# Patient Record
Sex: Male | Born: 1985 | Race: White | Hispanic: No | Marital: Single | Smoking: Former smoker
Health system: Southern US, Community
[De-identification: ages and names within clinical notes are randomized; demographics above are authoritative.]

## PROBLEM LIST (undated history)

## (undated) ENCOUNTER — Emergency Department (HOSPITAL_COMMUNITY): Admission: EM | Payer: Self-pay

## (undated) DIAGNOSIS — F909 Attention-deficit hyperactivity disorder, unspecified type: Secondary | ICD-10-CM

## (undated) DIAGNOSIS — J45909 Unspecified asthma, uncomplicated: Secondary | ICD-10-CM

## (undated) DIAGNOSIS — M795 Residual foreign body in soft tissue: Secondary | ICD-10-CM

## (undated) DIAGNOSIS — Z8719 Personal history of other diseases of the digestive system: Secondary | ICD-10-CM

## (undated) HISTORY — DX: Personal history of other diseases of the digestive system: Z87.19

## (undated) HISTORY — PX: APPENDECTOMY: SHX54

## (undated) HISTORY — PX: TONSILLECTOMY: SUR1361

## (undated) HISTORY — PX: OTHER SURGICAL HISTORY: SHX169

---

## 2007-10-27 ENCOUNTER — Ambulatory Visit: Payer: Self-pay | Admitting: *Deleted

## 2007-10-27 ENCOUNTER — Emergency Department (HOSPITAL_COMMUNITY): Admission: EM | Admit: 2007-10-27 | Discharge: 2007-10-27 | Payer: Self-pay | Admitting: Emergency Medicine

## 2007-10-27 ENCOUNTER — Inpatient Hospital Stay (HOSPITAL_COMMUNITY): Admission: AD | Admit: 2007-10-27 | Discharge: 2007-10-29 | Payer: Self-pay | Admitting: *Deleted

## 2010-01-10 ENCOUNTER — Ambulatory Visit (HOSPITAL_BASED_OUTPATIENT_CLINIC_OR_DEPARTMENT_OTHER): Admission: RE | Admit: 2010-01-10 | Discharge: 2010-01-10 | Payer: Self-pay | Admitting: Orthopedic Surgery

## 2010-01-23 ENCOUNTER — Encounter: Admission: RE | Admit: 2010-01-23 | Discharge: 2010-03-06 | Payer: Self-pay | Admitting: Orthopedic Surgery

## 2010-05-16 ENCOUNTER — Emergency Department (HOSPITAL_COMMUNITY): Admission: EM | Admit: 2010-05-16 | Discharge: 2010-01-04 | Payer: Self-pay | Admitting: Emergency Medicine

## 2010-05-29 ENCOUNTER — Emergency Department (HOSPITAL_COMMUNITY)
Admission: EM | Admit: 2010-05-29 | Discharge: 2010-05-29 | Payer: Self-pay | Source: Home / Self Care | Admitting: Emergency Medicine

## 2010-08-23 LAB — POCT HEMOGLOBIN-HEMACUE: Hemoglobin: 17.7 g/dL — ABNORMAL HIGH (ref 13.0–17.0)
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
## 2010-11-07 ENCOUNTER — Emergency Department (HOSPITAL_COMMUNITY)
Admission: EM | Admit: 2010-11-07 | Discharge: 2010-11-07 | Disposition: A | Discharge status: Home / Self Care | Payer: Self-pay | Attending: Emergency Medicine | Admitting: Emergency Medicine

## 2010-11-07 ENCOUNTER — Emergency Department (HOSPITAL_COMMUNITY): Payer: Self-pay

## 2010-11-07 DIAGNOSIS — R059 Cough, unspecified: Secondary | ICD-10-CM | POA: Insufficient documentation

## 2010-11-07 DIAGNOSIS — J069 Acute upper respiratory infection, unspecified: Secondary | ICD-10-CM | POA: Insufficient documentation

## 2010-11-07 DIAGNOSIS — R05 Cough: Secondary | ICD-10-CM | POA: Insufficient documentation

## 2011-03-05 LAB — DIFFERENTIAL
Basophils Absolute: 0
Eosinophils Absolute: 0
Eosinophils Relative: 0
Lymphocytes Relative: 25
Lymphs Abs: 2.6
Monocytes Absolute: 0.6
Neutrophils Relative %: 69

## 2011-03-05 LAB — CBC
HCT: 43.9
MCV: 89.5

## 2011-03-05 LAB — COMPREHENSIVE METABOLIC PANEL
BUN: 9
CO2: 24
Calcium: 9.2
GFR calc Af Amer: >60
Potassium: 3.9
Sodium: 136
Total Bilirubin: 1
Total Protein: 6.9

## 2011-03-05 LAB — ETHANOL: Alcohol, Ethyl (B): <5

## 2011-03-05 LAB — RAPID URINE DRUG SCREEN, HOSP PERFORMED
Amphetamines: NOT DETECTED
Barbiturates: NOT DETECTED

## 2011-03-05 LAB — PROTIME-INR
INR: 1.1
Prothrombin Time: 14.7

## 2013-01-21 ENCOUNTER — Emergency Department (HOSPITAL_COMMUNITY)
Admission: EM | Admit: 2013-01-21 | Discharge: 2013-01-22 | Disposition: A | Discharge status: Home / Self Care | Payer: Self-pay | Attending: Emergency Medicine | Admitting: Emergency Medicine

## 2013-01-21 ENCOUNTER — Emergency Department (HOSPITAL_COMMUNITY): Payer: Self-pay

## 2013-01-21 ENCOUNTER — Other Ambulatory Visit: Payer: Self-pay

## 2013-01-21 ENCOUNTER — Encounter (HOSPITAL_COMMUNITY): Payer: Self-pay | Admitting: Emergency Medicine

## 2013-01-21 DIAGNOSIS — R0789 Other chest pain: Secondary | ICD-10-CM | POA: Insufficient documentation

## 2013-01-21 DIAGNOSIS — F17200 Nicotine dependence, unspecified, uncomplicated: Secondary | ICD-10-CM | POA: Insufficient documentation

## 2013-01-21 DIAGNOSIS — Z791 Long term (current) use of non-steroidal anti-inflammatories (NSAID): Secondary | ICD-10-CM | POA: Insufficient documentation

## 2013-01-21 DIAGNOSIS — Z8659 Personal history of other mental and behavioral disorders: Secondary | ICD-10-CM | POA: Insufficient documentation

## 2013-01-21 DIAGNOSIS — G8929 Other chronic pain: Secondary | ICD-10-CM | POA: Insufficient documentation

## 2013-01-21 HISTORY — DX: Attention-deficit hyperactivity disorder, unspecified type: F90.9

## 2013-01-21 NOTE — ED Notes (Signed)
Pt c/o sharp stabbing pain to R side of chest, constant. Onset yesterday. Mild SHOB. Non strenuous activity at onset.

## 2013-01-22 LAB — CBC WITH DIFFERENTIAL/PLATELET
Eosinophils Absolute: 0.1 10*3/uL (ref 0.0–0.7)
Eosinophils Relative: 1 % (ref 0–5)
Hemoglobin: 15.7 g/dL (ref 13.0–17.0)
Lymphocytes Relative: 48 % — ABNORMAL HIGH (ref 12–46)
Lymphs Abs: 3.6 10*3/uL (ref 0.7–4.0)
MCHC: 36.1 g/dL — ABNORMAL HIGH (ref 30.0–36.0)
MCV: 87.7 fL (ref 78.0–100.0)
Neutro Abs: 3 10*3/uL (ref 1.7–7.7)
Neutrophils Relative %: 41 % — ABNORMAL LOW (ref 43–77)
Platelets: 221 10*3/uL (ref 150–400)
RDW: 11.8 % (ref 11.5–15.5)

## 2013-01-22 LAB — COMPREHENSIVE METABOLIC PANEL
ALT: 39 U/L (ref 0–53)
AST: 30 U/L (ref 0–37)
Albumin: 4.2 g/dL (ref 3.5–5.2)
Creatinine, Ser: 0.75 mg/dL (ref 0.50–1.35)
GFR calc Af Amer: >90 mL/min (ref >90)
GFR calc non Af Amer: >90 mL/min (ref >90)
Glucose, Bld: 89 mg/dL (ref 70–99)
Total Bilirubin: 0.3 mg/dL (ref 0.3–1.2)
Total Protein: 6.9 g/dL (ref 6.0–8.3)

## 2013-01-22 LAB — LIPASE, BLOOD: Lipase: 38 U/L (ref 11–59)

## 2013-01-22 LAB — TROPONIN I: Troponin I: <0.3 ng/mL (ref <0.30)

## 2013-01-22 MED ORDER — NAPROXEN 500 MG PO TABS
500.0000 mg | ORAL_TABLET | Freq: Once | ORAL | Status: AC
  Administered 2013-01-22: 500 mg via ORAL
  Filled 2013-01-22: qty 1

## 2013-01-22 MED ORDER — NAPROXEN 500 MG PO TABS: 500.0000 mg | ORAL_TABLET | Freq: Two times a day (BID) | ORAL | Status: DC

## 2013-01-22 NOTE — ED Provider Notes (Signed)
[name]: [id number]     Arrival date & time 01/21/13  2146 History     First MD Initiated Contact with Patient 01/22/13 0047     Chief Complaint  Patient presents with  . Chest Pain   (Consider location/radiation/quality/duration/timing/severity/associated sxs/prior Treatment) HPI 27 yo male presents to the ER with complaint of right lateral chest pain, sharp in nature, starting this evening around 930 pm.  Pt reports he always seems to have some pain in his chest, usually right sided but nothing this severe in the past.  Pain is very brief, lasting seconds.  He feels as though there is a catch in his lung that gets hung up causing the sharp pain, which then resolves.  No leg swelling, no immobilization.  Pt is regular marijuana user.  No family history of CAD, but mother has heart problems due to lyme disease.  Past Medical History  Diagnosis Date  . ADHD (attention deficit hyperactivity disorder)    Past Surgical History  Procedure Laterality Date  . Tonsillectomy    . Appendectomy    . Arm surgery Right    No family history on file. History  Substance Use Topics  . Smoking status: Current Some Day Smoker  . Smokeless tobacco: Not on file  . Alcohol Use: Yes     Comment: daily    Review of Systems  All other systems reviewed and are negative.    Allergies  Augmentin  Home Medications   Current Outpatient Rx  Name  Route  Sig  Dispense  Refill  . naproxen (NAPROSYN) 500 MG tablet   Oral   Take 1 tablet (500 mg total) by mouth 2 (two) times daily with a meal.   30 tablet   0    BP 121/58  Pulse 55  Temp(Src) 97.4 F (36.3 C) (Oral)  Resp 16  Wt 210 lb (95.255 kg)  SpO2 98% Physical Exam  Nursing note and vitals reviewed. Constitutional: He is oriented to person, place, and time. He appears well-developed and well-nourished.  HENT:  Head: Normocephalic and atraumatic.  Right Ear: External ear normal.  Left Ear: External ear normal.  Nose: Nose normal.   Mouth/Throat: Oropharynx is clear and moist.  Eyes: Conjunctivae and EOM are normal. Pupils are equal, round, and reactive to light.  Neck: Normal range of motion. Neck supple. No JVD present. No tracheal deviation present. No thyromegaly present.  Cardiovascular: Normal rate, regular rhythm, normal heart sounds and intact distal pulses.  Exam reveals no gallop and no friction rub.   No murmur heard. Pulmonary/Chest: Effort normal and breath sounds normal. No stridor. No respiratory distress. He has no wheezes. He has no rales. He exhibits no tenderness.  Abdominal: Soft. Bowel sounds are normal. He exhibits no distension and no mass. There is no tenderness. There is no rebound and no guarding.  Musculoskeletal: Normal range of motion. He exhibits no edema and no tenderness.  Lymphadenopathy:    He has no cervical adenopathy.  Neurological: He is alert and oriented to person, place, and time. He exhibits normal muscle tone. Coordination normal.  Skin: Skin is warm and dry. No rash noted. No erythema. No pallor.  Psychiatric: He has a normal mood and affect. His behavior is normal. Judgment and thought content normal.    ED Course   Procedures (including critical care time)  Labs Reviewed  CBC WITH DIFFERENTIAL - Abnormal; Notable for the following:    MCHC 36.1 (*)    Neutrophils Relative %  41 (*)    Lymphocytes Relative 48 (*)    All other components within normal limits  COMPREHENSIVE METABOLIC PANEL  LIPASE, BLOOD  D-DIMER, QUANTITATIVE  TROPONIN I   Dg Chest 2 View  01/21/2013   *RADIOLOGY REPORT*  Clinical Data: Chest pain, short breath  CHEST - 2 VIEW  Comparison: Prior chest x-ray 11/07/2010  Findings: The lungs are well-aerated and free from pulmonary edema, focal airspace consolidation or pulmonary nodule.  Cardiac and mediastinal contours are within normal limits.  No pneumothorax, or pleural effusion. No acute osseous findings.  IMPRESSION:  No acute cardiopulmonary  disease.   Original Report Authenticated By: Malachy Moan, M.D.     Date: 01/22/2013  Rate: 75  Rhythm: normal sinus rhythm  QRS Axis: normal  Intervals: PR prolonged  ST/T Wave abnormalities: normal  Conduction Disutrbances:first-degree A-V block   Narrative Interpretation:   Old EKG Reviewed: none available    1. Atypical chest pain     MDM  27 yo male with chronic chest pain but acute worsening this evening.  Workup unremarkable, labs normal.  Pain resolved with naprosyn.  Will d/c home, give referral information.  Olivia Mackie, MD 01/22/13 [phone]

## 2013-03-07 ENCOUNTER — Emergency Department (HOSPITAL_COMMUNITY): Payer: Self-pay

## 2013-03-07 ENCOUNTER — Emergency Department (HOSPITAL_COMMUNITY)
Admission: EM | Admit: 2013-03-07 | Discharge: 2013-03-07 | Disposition: A | Discharge status: Home / Self Care | Payer: Self-pay | Attending: Emergency Medicine | Admitting: Emergency Medicine

## 2013-03-07 ENCOUNTER — Encounter (HOSPITAL_COMMUNITY): Payer: Self-pay | Admitting: Emergency Medicine

## 2013-03-07 DIAGNOSIS — Z8659 Personal history of other mental and behavioral disorders: Secondary | ICD-10-CM | POA: Insufficient documentation

## 2013-03-07 DIAGNOSIS — Y9389 Activity, other specified: Secondary | ICD-10-CM | POA: Insufficient documentation

## 2013-03-07 DIAGNOSIS — Z9889 Other specified postprocedural states: Secondary | ICD-10-CM | POA: Insufficient documentation

## 2013-03-07 DIAGNOSIS — F17200 Nicotine dependence, unspecified, uncomplicated: Secondary | ICD-10-CM | POA: Insufficient documentation

## 2013-03-07 DIAGNOSIS — W010XXA Fall on same level from slipping, tripping and stumbling without subsequent striking against object, initial encounter: Secondary | ICD-10-CM | POA: Insufficient documentation

## 2013-03-07 DIAGNOSIS — Z23 Encounter for immunization: Secondary | ICD-10-CM | POA: Insufficient documentation

## 2013-03-07 DIAGNOSIS — Y9289 Other specified places as the place of occurrence of the external cause: Secondary | ICD-10-CM | POA: Insufficient documentation

## 2013-03-07 DIAGNOSIS — S6990XA Unspecified injury of unspecified wrist, hand and finger(s), initial encounter: Secondary | ICD-10-CM | POA: Insufficient documentation

## 2013-03-07 DIAGNOSIS — M79631 Pain in right forearm: Secondary | ICD-10-CM

## 2013-03-07 DIAGNOSIS — S59909A Unspecified injury of unspecified elbow, initial encounter: Secondary | ICD-10-CM | POA: Insufficient documentation

## 2013-03-07 DIAGNOSIS — W108XXA Fall (on) (from) other stairs and steps, initial encounter: Secondary | ICD-10-CM | POA: Insufficient documentation

## 2013-03-07 MED ORDER — ACETAMINOPHEN 325 MG PO TABS
650.0000 mg | ORAL_TABLET | Freq: Once | ORAL | Status: AC
  Administered 2013-03-07: 650 mg via ORAL
  Filled 2013-03-07: qty 2

## 2013-03-07 MED ORDER — IBUPROFEN 800 MG PO TABS
800.0000 mg | ORAL_TABLET | Freq: Once | ORAL | Status: AC
  Administered 2013-03-07: 800 mg via ORAL
  Filled 2013-03-07: qty 1

## 2013-03-07 MED ORDER — TETANUS-DIPHTH-ACELL PERTUSSIS 5-2.5-18.5 LF-MCG/0.5 IM SUSP
0.5000 mL | Freq: Once | INTRAMUSCULAR | Status: AC
  Administered 2013-03-07: 0.5 mL via INTRAMUSCULAR
  Filled 2013-03-07: qty 0.5

## 2013-03-07 NOTE — ED Notes (Signed)
Pt reports having a arm surgery in which a plate was placed in his right forearm, which was never removed. Pt reports hitting his right forearm while falling down a set of stairs two days ago. Area has an abrasion and pt reports 10/10 pain. Pt is A/O x4 and in NAD.

## 2013-03-07 NOTE — ED Provider Notes (Signed)
[name]: [id number]     Arrival date & time 03/07/13  1324 History  This chart was scribed for non-physician practitioner Coral Ceo, PA-C working with Lyanne Co, MD by Joaquin Music, ED Scribe. This patient was seen in room WTR8/WTR8 and the patient's care was started at 4:00 PM .   Chief Complaint  Patient presents with  . Arm Injury   The history is provided by the patient. No language interpreter was used.   HPI Comments: Shane Wheeler is a 27 y.o. male who presents to the Emergency Department complaining of right arm pain.  Two days ago, patient tripped and fell down a set of stairs and landed on his right side injuring his right forearm.  He denies any head injury, syncope, or LOC.  Pt denies any other injuries. He states that he had surgery with Dr. Merlyn Lot several years ago and had a placed a plate in his forearm years ago.  Patient states he has had pain over his post-op site since surgery, which is worse after this injury.  Pt denies numbness and tingling, weakness or loss of sensaion. Pt denies headache, dizziness, lightheadedness, neck pain, chest pain, SOB, abdominal pain, nausea, vomiting, and any other associated symptoms. Tetanus not up to date.     Past Medical History  Diagnosis Date  . ADHD (attention deficit hyperactivity disorder)    Past Surgical History  Procedure Laterality Date  . Tonsillectomy    . Appendectomy    . Arm surgery Right    No family history on file. History  Substance Use Topics  . Smoking status: Current Some Day Smoker  . Smokeless tobacco: Not on file  . Alcohol Use: Yes     Comment: daily    Review of Systems  Constitutional: Negative for fever, activity change and appetite change.  HENT: Negative for trouble swallowing, neck pain, neck stiffness and dental problem.   Eyes: Negative for visual disturbance.  Respiratory: Negative for shortness of breath.   Cardiovascular: Negative for chest pain.  Gastrointestinal:  Negative for nausea, vomiting and abdominal pain.  Genitourinary: Negative for dysuria and hematuria.  Musculoskeletal: Negative for back pain and gait problem.  Skin: Positive for wound.  Neurological: Negative for dizziness, weakness, light-headedness, numbness and headaches.  Psychiatric/Behavioral: Negative for confusion.    Allergies  Augmentin  Home Medications   Current Outpatient Rx  Name  Route  Sig  Dispense  Refill  . Aspirin Effervescent (ALKA-SELTZER EXTRA STRENGTH) 500 MG TBEF   Oral   Take 2 tablets by mouth every 6 (six) hours as needed (pain).          BP 119/75  Pulse 64  Temp(Src) 98.7 F (37.1 C)  Resp 18  SpO2 98%  Filed Vitals:   03/07/13 1352 03/07/13 1636 03/07/13 1639  BP: 119/75 139/63   Pulse: 64 66   Temp: 98.7 F (37.1 C)  98.4 F (36.9 C)  TempSrc:  Oral Oral  Resp: 18 18   SpO2: 98% 100%     Physical Exam  Nursing note and vitals reviewed. Constitutional: He is oriented to person, place, and time. He appears well-developed and well-nourished. No distress.  HENT:  Head: Normocephalic and atraumatic.  Right Ear: External ear normal.  Left Ear: External ear normal.  Nose: Nose normal.  No tenderness to palpation to the scalp throughout.  No palpable hematomas or step-offs  Eyes: Conjunctivae are normal. Pupils are equal, round, and reactive to light. Right eye exhibits no  discharge. Left eye exhibits no discharge.  Neck: Normal range of motion. Neck supple.  No cervical spinal or paraspinal tenderness.    Cardiovascular: Normal rate, regular rhythm, normal heart sounds and intact distal pulses.  Exam reveals no gallop and no friction rub.   No murmur heard. Radial pulses present and equal bilaterally   Pulmonary/Chest: Effort normal and breath sounds normal. No respiratory distress. He has no wheezes. He has no rales. He exhibits no tenderness.  Abdominal: Soft. Bowel sounds are normal. He exhibits no distension. There is no  tenderness.  Musculoskeletal: Normal range of motion. He exhibits no edema and no tenderness.  Grip strength 5/5 bilaterally.  No limitations with circumduction of the right wrist.  No tenderness to palpation to the right forearm throughout.  No right elbow tenderness to palpation.  No limitations with right elbow flexion and extension.  No thoracic or lumbar spinal tenderness.  Patient able to ambulate without difficulty or ataxia  Neurological: He is alert and oriented to person, place, and time.  Sensation intact in the upper extremities  Skin: Skin is warm and dry. He is not diaphoretic.  Abrasions to the right medial forearm with no open wounds or bleeding.      ED Course  Procedures  DIAGNOSTIC STUDIES: Oxygen Saturation is 98% on RA, normal by my interpretation.    COORDINATION OF CARE: 4:06 PM-Discussed labs with pt. Administer pain medication while in ED. Will give pt tetanus shot. Will provide pt with list of PCP. Pt agreed to plan.   Labs Review Labs Reviewed - No data to display Imaging Review Dg Forearm Right  03/07/2013   CLINICAL DATA:  Pain post trauma  EXAM: RIGHT FOREARM - 2 VIEW  COMPARISON:  Right wrist January 04, 2010  FINDINGS: Frontal and lateral views were obtained. There is screw and plate fixation to an old fracture of the distal ulna. There is no acute fracture or dislocation. Joint spaces appear intact. No abnormal periosteal reaction.  IMPRESSION: Postoperative change distal ulna. No acute fracture or dislocation.   Electronically Signed   By: Bretta Bang   On: 03/07/2013 15:06   DG Forearm Right (Final result)  Result time: 03/07/13 15:06:07    Final result by Rad Results In Interface (03/07/13 15:06:07)    Narrative:   CLINICAL DATA: Pain post trauma  EXAM: RIGHT FOREARM - 2 VIEW  COMPARISON: Right wrist January 04, 2010  FINDINGS: Frontal and lateral views were obtained. There is screw and plate fixation to an old fracture of the distal ulna.  There is no acute fracture or dislocation. Joint spaces appear intact. No abnormal periosteal reaction.  IMPRESSION: Postoperative change distal ulna. No acute fracture or dislocation.   Electronically Signed By: Bretta Bang On: 03/07/2013 15:06    MDM   1. Right forearm pain     Chadd Tollison is a 27 y.o. male who presents to the Emergency Department complaining of right arm pain.  X-rays of right forearm ordered.  Ibuprofen and Tylenol ordered for pain.  Tetanus booster given.      Etiology of right arm pain is likely due to a contusion.  X-rays negative for fx or dislocation.  Wounds did not require repair and abx ointment and bandages were applied.  Patient was neurovascularly intact.  Patient was instructed to follow-up with his previous surgeon for further evaluation and management.  He was instructed to return to the ED if he has any concerns including weakness, cyanosis, spreading edema/erythema.  Patient was in agreement with discharge and plan.     Final impressions: 1. Right forearm pain     Luiz Iron PA-C   This patient was discussed with Dr. Patria Mane   I personally performed the services described in this documentation, which was scribed in my presence. The recorded information has been reviewed and is accurate.   Jillyn Ledger, PA-C 03/08/13 1515

## 2013-03-08 NOTE — ED Provider Notes (Signed)
Medical screening examination/treatment/procedure(s) were performed by non-physician practitioner and as supervising physician I was immediately available for consultation/collaboration.  Lyanne Co, MD 03/08/13 1550

## 2014-03-02 ENCOUNTER — Ambulatory Visit: Payer: Self-pay

## 2014-03-02 ENCOUNTER — Emergency Department (HOSPITAL_COMMUNITY): Payer: Self-pay

## 2014-03-02 ENCOUNTER — Emergency Department (HOSPITAL_COMMUNITY)
Admission: EM | Admit: 2014-03-02 | Discharge: 2014-03-02 | Disposition: A | Discharge status: Home / Self Care | Payer: Self-pay | Attending: Emergency Medicine | Admitting: Emergency Medicine

## 2014-03-02 ENCOUNTER — Encounter (HOSPITAL_COMMUNITY): Payer: Self-pay | Admitting: Emergency Medicine

## 2014-03-02 DIAGNOSIS — R1031 Right lower quadrant pain: Secondary | ICD-10-CM | POA: Insufficient documentation

## 2014-03-02 DIAGNOSIS — F17200 Nicotine dependence, unspecified, uncomplicated: Secondary | ICD-10-CM | POA: Insufficient documentation

## 2014-03-02 DIAGNOSIS — Z8659 Personal history of other mental and behavioral disorders: Secondary | ICD-10-CM | POA: Insufficient documentation

## 2014-03-02 LAB — CBC WITH DIFFERENTIAL/PLATELET
Basophils Absolute: 0 10*3/uL (ref 0.0–0.1)
Basophils Relative: 0 % (ref 0–1)
EOS ABS: 0.2 10*3/uL (ref 0.0–0.7)
EOS PCT: 2 % (ref 0–5)
HCT: 45.7 % (ref 39.0–52.0)
HEMOGLOBIN: 16.5 g/dL (ref 13.0–17.0)
LYMPHS ABS: 4.9 10*3/uL — AB (ref 0.7–4.0)
Lymphocytes Relative: 46 % (ref 12–46)
MCH: 31.9 pg (ref 26.0–34.0)
MCHC: 36.1 g/dL — ABNORMAL HIGH (ref 30.0–36.0)
MCV: 88.2 fL (ref 78.0–100.0)
MONOS PCT: 8 % (ref 3–12)
Monocytes Absolute: 0.8 10*3/uL (ref 0.1–1.0)
Neutro Abs: 4.7 10*3/uL (ref 1.7–7.7)
Neutrophils Relative %: 44 % (ref 43–77)
Platelets: 228 10*3/uL (ref 150–400)
RBC: 5.18 MIL/uL (ref 4.22–5.81)
RDW: 11.9 % (ref 11.5–15.5)
WBC: 10.6 10*3/uL — ABNORMAL HIGH (ref 4.0–10.5)

## 2014-03-02 LAB — COMPREHENSIVE METABOLIC PANEL
ALT: 56 U/L — ABNORMAL HIGH (ref 0–53)
AST: 39 U/L — ABNORMAL HIGH (ref 0–37)
Albumin: 4.8 g/dL (ref 3.5–5.2)
Alkaline Phosphatase: 97 U/L (ref 39–117)
Anion gap: 17 — ABNORMAL HIGH (ref 5–15)
BUN: 18 mg/dL (ref 6–23)
CO2: 24 mEq/L (ref 19–32)
Calcium: 9.4 mg/dL (ref 8.4–10.5)
Chloride: 98 mEq/L (ref 96–112)
Creatinine, Ser: 0.76 mg/dL (ref 0.50–1.35)
GFR calc Af Amer: >90 mL/min (ref >90)
GFR calc non Af Amer: >90 mL/min (ref >90)
Glucose, Bld: 94 mg/dL (ref 70–99)
Potassium: 3.8 mEq/L (ref 3.7–5.3)
Sodium: 139 mEq/L (ref 137–147)
Total Bilirubin: 0.4 mg/dL (ref 0.3–1.2)
Total Protein: 7.9 g/dL (ref 6.0–8.3)

## 2014-03-02 LAB — URINALYSIS, ROUTINE W REFLEX MICROSCOPIC
Bilirubin Urine: NEGATIVE
Glucose, UA: NEGATIVE mg/dL
Hgb urine dipstick: NEGATIVE
Ketones, ur: NEGATIVE mg/dL
Leukocytes, UA: NEGATIVE
NITRITE: NEGATIVE
PROTEIN: NEGATIVE mg/dL
SPECIFIC GRAVITY, URINE: 1.021 (ref 1.005–1.030)
Urobilinogen, UA: 1 mg/dL (ref 0.0–1.0)
pH: 7.5 (ref 5.0–8.0)

## 2014-03-02 MED ORDER — DICYCLOMINE HCL 20 MG PO TABS: 20.0000 mg | ORAL_TABLET | Freq: Two times a day (BID) | ORAL | Status: DC

## 2014-03-02 NOTE — ED Notes (Signed)
Initial Contact - Pt states that discomfort that is "like a rock in his stomach" began 3-5 days ago. Pt has not had any symptoms like this before. Pt states he has pain also in his lower back but denies any difficulty urinating. Pt reports normal bowel movements, denies N/V/D. PT denies any previous GI/GU medical history. Pt VSS, NAD.

## 2014-03-02 NOTE — ED Provider Notes (Signed)
[name]: [id number]     Arrival date & time 03/02/14  1847 History   First MD Initiated Contact with Patient 03/02/14 1925     Chief Complaint  Patient presents with  . Abdominal Pain     (Consider location/radiation/quality/duration/timing/severity/associated sxs/prior Treatment) HPI Comments: Patient presents with abdominal pain. He states it's been going on for about 3-5 days and is been worsening over the last 2 days. He feels like there's a knot on the right side of his abdomen. He states it's always there but waxes and wanes in intensity. He has had a appendectomy previously. He denies any nausea or vomiting. He denies a fevers or chills. Denies any change in bowel habits. He denies any urinary problems. He denies the need for any current pain medicine.  Patient is a 28 y.o. male presenting with abdominal pain.  Abdominal Pain Associated symptoms: no chest pain, no chills, no cough, no diarrhea, no fatigue, no fever, no hematuria, no nausea, no shortness of breath and no vomiting     Past Medical History  Diagnosis Date  . ADHD (attention deficit hyperactivity disorder)    Past Surgical History  Procedure Laterality Date  . Tonsillectomy    . Appendectomy    . Arm surgery Right    History reviewed. No pertinent family history. History  Substance Use Topics  . Smoking status: Current Some Day Smoker  . Smokeless tobacco: Not on file  . Alcohol Use: Yes     Comment: daily    Review of Systems  Constitutional: Negative for fever, chills, diaphoresis and fatigue.  HENT: Negative for congestion, rhinorrhea and sneezing.   Eyes: Negative.   Respiratory: Negative for cough, chest tightness and shortness of breath.   Cardiovascular: Negative for chest pain and leg swelling.  Gastrointestinal: Positive for abdominal pain. Negative for nausea, vomiting, diarrhea and blood in stool.  Genitourinary: Negative for frequency, hematuria, flank pain and difficulty urinating.   Musculoskeletal: Negative for arthralgias and back pain.  Skin: Negative for rash.  Neurological: Negative for dizziness, speech difficulty, weakness, numbness and headaches.      Allergies  Augmentin  Home Medications   Prior to Admission medications   Medication Sig Start Date End Date Taking? Authorizing Provider  Aspirin Effervescent (ALKA-SELTZER EXTRA STRENGTH) 500 MG TBEF Take 2 tablets by mouth every 6 (six) hours as needed (pain).   Yes Historical Provider, MD  dicyclomine (BENTYL) 20 MG tablet Take 1 tablet (20 mg total) by mouth 2 (two) times daily. 03/02/14   Rolan Bucco, MD   BP 110/85  Pulse 68  Temp(Src) 98 F (36.7 C) (Oral)  Resp 20  SpO2 100% Physical Exam  Constitutional: He is oriented to person, place, and time. He appears well-developed and well-nourished.  HENT:  Head: Normocephalic and atraumatic.  Eyes: Pupils are equal, round, and reactive to light.  Neck: Normal range of motion. Neck supple.  Cardiovascular: Normal rate, regular rhythm and normal heart sounds.   Pulmonary/Chest: Effort normal and breath sounds normal. No respiratory distress. He has no wheezes. He has no rales. He exhibits no tenderness.  Abdominal: Soft. Bowel sounds are normal. There is tenderness (ppositive tenderness in the right midabdomen. There is mild tenderness in the right mid back. There's no pain in the inguinal areas or testicular areas.). There is no rebound and no guarding.  Musculoskeletal: Normal range of motion. He exhibits no edema.  Lymphadenopathy:    He has no cervical adenopathy.  Neurological: He is alert and oriented  to person, place, and time.  Skin: Skin is warm and dry. No rash noted.  Psychiatric: He has a normal mood and affect.    ED Course  Procedures (including critical care time) Labs Review Results for orders placed during the hospital encounter of 03/02/14  CBC WITH DIFFERENTIAL      Result Value Ref Range   WBC 10.6 (*) 4.0 - 10.5 K/uL    RBC 5.18  4.22 - 5.81 MIL/uL   Hemoglobin 16.5  13.0 - 17.0 g/dL   HCT 09.8  11.9 - 14.7 %   MCV 88.2  78.0 - 100.0 fL   MCH 31.9  26.0 - 34.0 pg   MCHC 36.1 (*) 30.0 - 36.0 g/dL   RDW 82.9  56.2 - 13.0 %   Platelets 228  150 - 400 K/uL   Neutrophils Relative % 44  43 - 77 %   Neutro Abs 4.7  1.7 - 7.7 K/uL   Lymphocytes Relative 46  12 - 46 %   Lymphs Abs 4.9 (*) 0.7 - 4.0 K/uL   Monocytes Relative 8  3 - 12 %   Monocytes Absolute 0.8  0.1 - 1.0 K/uL   Eosinophils Relative 2  0 - 5 %   Eosinophils Absolute 0.2  0.0 - 0.7 K/uL   Basophils Relative 0  0 - 1 %   Basophils Absolute 0.0  0.0 - 0.1 K/uL  COMPREHENSIVE METABOLIC PANEL      Result Value Ref Range   Sodium 139  137 - 147 mEq/L   Potassium 3.8  3.7 - 5.3 mEq/L   Chloride 98  96 - 112 mEq/L   CO2 24  19 - 32 mEq/L   Glucose, Bld 94  70 - 99 mg/dL   BUN 18  6 - 23 mg/dL   Creatinine, Ser 8.65  0.50 - 1.35 mg/dL   Calcium 9.4  8.4 - 78.4 mg/dL   Total Protein 7.9  6.0 - 8.3 g/dL   Albumin 4.8  3.5 - 5.2 g/dL   AST 39 (*) 0 - 37 U/L   ALT 56 (*) 0 - 53 U/L   Alkaline Phosphatase 97  39 - 117 U/L   Total Bilirubin 0.4  0.3 - 1.2 mg/dL   GFR calc non Af Amer >90  >90 mL/min   GFR calc Af Amer >90  >90 mL/min   Anion gap 17 (*) 5 - 15  URINALYSIS, ROUTINE W REFLEX MICROSCOPIC      Result Value Ref Range   Color, Urine YELLOW  YELLOW   APPearance CLEAR  CLEAR   Specific Gravity, Urine 1.021  1.005 - 1.030   pH 7.5  5.0 - 8.0   Glucose, UA NEGATIVE  NEGATIVE mg/dL   Hgb urine dipstick NEGATIVE  NEGATIVE   Bilirubin Urine NEGATIVE  NEGATIVE   Ketones, ur NEGATIVE  NEGATIVE mg/dL   Protein, ur NEGATIVE  NEGATIVE mg/dL   Urobilinogen, UA 1.0  0.0 - 1.0 mg/dL   Nitrite NEGATIVE  NEGATIVE   Leukocytes, UA NEGATIVE  NEGATIVE   Ct Abdomen Pelvis Wo Contrast  03/02/2014   CLINICAL DATA:  Right flank pain  EXAM: CT ABDOMEN AND PELVIS WITHOUT CONTRAST  TECHNIQUE: Multidetector CT imaging of the abdomen and pelvis was  performed following the standard protocol without IV contrast.  COMPARISON:  None.  FINDINGS: BODY WALL: Unremarkable.  LOWER CHEST: Unremarkable.  ABDOMEN/PELVIS:  Liver: No focal abnormality.  Biliary: No evidence of biliary obstruction or stone.  Pancreas: Unremarkable.  Spleen: Unremarkable.  Adrenals: Unremarkable.  Kidneys and ureters: No hydronephrosis or stone.  Bladder: Unremarkable.  Reproductive: Unremarkable.  Bowel: No obstruction. Normal appendix.  Retroperitoneum: No mass or adenopathy.  Peritoneum: No ascites or pneumoperitoneum.  Vascular: No acute abnormality.  OSSEOUS: No acute abnormalities.  Incidental L4 limbus vertebra  IMPRESSION: Negative.  No findings to explain flank pain.   Electronically Signed   By: Tiburcio Pea M.D.   On: 03/02/2014 22:00      Imaging Review Ct Abdomen Pelvis Wo Contrast  03/02/2014   CLINICAL DATA:  Right flank pain  EXAM: CT ABDOMEN AND PELVIS WITHOUT CONTRAST  TECHNIQUE: Multidetector CT imaging of the abdomen and pelvis was performed following the standard protocol without IV contrast.  COMPARISON:  None.  FINDINGS: BODY WALL: Unremarkable.  LOWER CHEST: Unremarkable.  ABDOMEN/PELVIS:  Liver: No focal abnormality.  Biliary: No evidence of biliary obstruction or stone.  Pancreas: Unremarkable.  Spleen: Unremarkable.  Adrenals: Unremarkable.  Kidneys and ureters: No hydronephrosis or stone.  Bladder: Unremarkable.  Reproductive: Unremarkable.  Bowel: No obstruction. Normal appendix.  Retroperitoneum: No mass or adenopathy.  Peritoneum: No ascites or pneumoperitoneum.  Vascular: No acute abnormality.  OSSEOUS: No acute abnormalities.  Incidental L4 limbus vertebra  IMPRESSION: Negative.  No findings to explain flank pain.   Electronically Signed   By: Tiburcio Pea M.D.   On: 03/02/2014 22:00     EKG Interpretation None      MDM   Final diagnoses:  Right lower quadrant abdominal pain    Patient presents with a right-sided mid abdominal pain.  At times it radiates to his back. There's no evidence of kidney stones on the CT scan. He has mild elevation of LFTs but he says he's a fairly heavy drinker. He asked he does not have any tenderness around his gallbladder area. There is no evidence of gallstones cervical spine CT. He has minimal tenderness on his abdomen on reexam. He was discharged in good condition. He was given prescription for Bentyl to use and given a referral to gastroenterology as needed if his symptoms continue. He was advised to return here if he has worsening pain vomiting or fevers.    Rolan Bucco, MD 03/02/14 2218

## 2014-03-02 NOTE — Discharge Instructions (Signed)

## 2014-03-02 NOTE — ED Notes (Signed)
Patient is alert and oriented x3.  He is complaining of abdominal discomfort that  Started about 3 to 5 days ago.  He states that he feels like a rock in his stomach. He rates his discomfort 7 of 10.  He denies this issue before

## 2014-03-02 NOTE — ED Notes (Signed)
Patient transported to CT 

## 2015-08-15 ENCOUNTER — Ambulatory Visit: Payer: Self-pay

## 2015-12-28 ENCOUNTER — Encounter (HOSPITAL_COMMUNITY): Payer: Self-pay | Admitting: Emergency Medicine

## 2015-12-28 ENCOUNTER — Emergency Department (HOSPITAL_COMMUNITY): Payer: Self-pay

## 2015-12-28 ENCOUNTER — Emergency Department (HOSPITAL_COMMUNITY)
Admission: EM | Admit: 2015-12-28 | Discharge: 2015-12-28 | Disposition: A | Discharge status: Home / Self Care | Payer: Self-pay | Attending: Emergency Medicine | Admitting: Emergency Medicine

## 2015-12-28 DIAGNOSIS — F1290 Cannabis use, unspecified, uncomplicated: Secondary | ICD-10-CM | POA: Insufficient documentation

## 2015-12-28 DIAGNOSIS — R0789 Other chest pain: Secondary | ICD-10-CM | POA: Insufficient documentation

## 2015-12-28 DIAGNOSIS — F17210 Nicotine dependence, cigarettes, uncomplicated: Secondary | ICD-10-CM | POA: Insufficient documentation

## 2015-12-28 DIAGNOSIS — J45909 Unspecified asthma, uncomplicated: Secondary | ICD-10-CM | POA: Insufficient documentation

## 2015-12-28 DIAGNOSIS — F1490 Cocaine use, unspecified, uncomplicated: Secondary | ICD-10-CM | POA: Insufficient documentation

## 2015-12-28 DIAGNOSIS — F909 Attention-deficit hyperactivity disorder, unspecified type: Secondary | ICD-10-CM | POA: Insufficient documentation

## 2015-12-28 DIAGNOSIS — R079 Chest pain, unspecified: Secondary | ICD-10-CM

## 2015-12-28 HISTORY — DX: Unspecified asthma, uncomplicated: J45.909

## 2015-12-28 LAB — DIFFERENTIAL
Basophils Absolute: 0 10*3/uL (ref 0.0–0.1)
Basophils Relative: 0 %
EOS ABS: 0 10*3/uL (ref 0.0–0.7)
EOS PCT: 0 %
Lymphocytes Relative: 31 %
Lymphs Abs: 3.4 10*3/uL (ref 0.7–4.0)
MONO ABS: 0.9 10*3/uL (ref 0.1–1.0)
Monocytes Relative: 8 %
NEUTROS PCT: 61 %
Neutro Abs: 6.6 10*3/uL (ref 1.7–7.7)

## 2015-12-28 LAB — BASIC METABOLIC PANEL
Anion gap: 12 (ref 5–15)
BUN: 8 mg/dL (ref 6–20)
CHLORIDE: 104 mmol/L (ref 101–111)
CO2: 22 mmol/L (ref 22–32)
CREATININE: 0.85 mg/dL (ref 0.61–1.24)
Calcium: 9.5 mg/dL (ref 8.9–10.3)
GFR calc non Af Amer: >60 mL/min (ref >60)
Glucose, Bld: 100 mg/dL — ABNORMAL HIGH (ref 65–99)
Potassium: 3.2 mmol/L — ABNORMAL LOW (ref 3.5–5.1)
Sodium: 138 mmol/L (ref 135–145)

## 2015-12-28 LAB — I-STAT TROPONIN, ED
TROPONIN I, POC: 0 ng/mL (ref 0.00–0.08)
Troponin i, poc: 0 ng/mL (ref 0.00–0.08)

## 2015-12-28 LAB — MAGNESIUM: MAGNESIUM: 2 mg/dL (ref 1.7–2.4)

## 2015-12-28 LAB — CBC
HCT: 46.7 % (ref 39.0–52.0)
Hemoglobin: 16.6 g/dL (ref 13.0–17.0)
MCH: 31 pg (ref 26.0–34.0)
MCHC: 35.5 g/dL (ref 30.0–36.0)
MCV: 87.3 fL (ref 78.0–100.0)
PLATELETS: 255 10*3/uL (ref 150–400)
RBC: 5.35 MIL/uL (ref 4.22–5.81)
RDW: 12.2 % (ref 11.5–15.5)
WBC: 11 10*3/uL — AB (ref 4.0–10.5)

## 2015-12-28 MED ORDER — SODIUM CHLORIDE 0.9 % IV BOLUS (SEPSIS)
1000.0000 mL | Freq: Once | INTRAVENOUS | Status: AC
  Administered 2015-12-28: 1000 mL via INTRAVENOUS

## 2015-12-28 MED ORDER — LORAZEPAM 2 MG/ML IJ SOLN
1.0000 mg | Freq: Once | INTRAMUSCULAR | Status: AC
  Administered 2015-12-28: 1 mg via INTRAVENOUS
  Filled 2015-12-28 (×2): qty 1

## 2015-12-28 NOTE — ED Notes (Signed)
Discharge instructions and follow up care reviewed with patient. Patient verbalized understanding. 

## 2015-12-28 NOTE — Discharge Instructions (Signed)
Avoid illicit drug use to prevent recurrence of symptoms. Return for worsening symptoms, including passing out, difficulty breathing, recurrent chest pain or any other symptoms concerning to you.  Nonspecific Chest Pain  Chest pain can be caused by many different conditions. There is always a chance that your pain could be related to something serious, such as a heart attack or a blood clot in your lungs. Chest pain can also be caused by conditions that are not life-threatening. If you have chest pain, it is very important to follow up with your health care provider. CAUSES  Chest pain can be caused by:  Heartburn.  Pneumonia or bronchitis.  Anxiety or stress.  Inflammation around your heart (pericarditis) or lung (pleuritis or pleurisy).  A blood clot in your lung.  A collapsed lung (pneumothorax). It can develop suddenly on its own (spontaneous pneumothorax) or from trauma to the chest.  Shingles infection (varicella-zoster virus).  Heart attack.  Damage to the bones, muscles, and cartilage that make up your chest wall. This can include:  Bruised bones due to injury.  Strained muscles or cartilage due to frequent or repeated coughing or overwork.  Fracture to one or more ribs.  Sore cartilage due to inflammation (costochondritis). RISK FACTORS  Risk factors for chest pain may include:  Activities that increase your risk for trauma or injury to your chest.  Respiratory infections or conditions that cause frequent coughing.  Medical conditions or overeating that can cause heartburn.  Heart disease or family history of heart disease.  Conditions or health behaviors that increase your risk of developing a blood clot.  Having had chicken pox (varicella zoster). SIGNS AND SYMPTOMS Chest pain can feel like:  Burning or tingling on the surface of your chest or deep in your chest.  Crushing, pressure, aching, or squeezing pain.  Dull or sharp pain that is worse when you  move, cough, or take a deep breath.  Pain that is also felt in your back, neck, shoulder, or arm, or pain that spreads to any of these areas. Your chest pain may come and go, or it may stay constant. DIAGNOSIS Lab tests or other studies may be needed to find the cause of your pain. Your health care provider may have you take a test called an ambulatory ECG (electrocardiogram). An ECG records your heartbeat patterns at the time the test is performed. You may also have other tests, such as:  Transthoracic echocardiogram (TTE). During echocardiography, sound waves are used to create a picture of all of the heart structures and to look at how blood flows through your heart.  Transesophageal echocardiogram (TEE).This is a more advanced imaging test that obtains images from inside your body. It allows your health care provider to see your heart in finer detail.  Cardiac monitoring. This allows your health care provider to monitor your heart rate and rhythm in real time.  Holter monitor. This is a portable device that records your heartbeat and can help to diagnose abnormal heartbeats. It allows your health care provider to track your heart activity for several days, if needed.  Stress tests. These can be done through exercise or by taking medicine that makes your heart beat more quickly.  Blood tests.  Imaging tests. TREATMENT  Your treatment depends on what is causing your chest pain. Treatment may include:  Medicines. These may include:  Acid blockers for heartburn.  Anti-inflammatory medicine.  Pain medicine for inflammatory conditions.  Antibiotic medicine, if an infection is present.  Medicines  to dissolve blood clots.  Medicines to treat coronary artery disease.  Supportive care for conditions that do not require medicines. This may include:  Resting.  Applying heat or cold packs to injured areas.  Limiting activities until pain decreases. HOME CARE INSTRUCTIONS  If you  were prescribed an antibiotic medicine, finish it all even if you start to feel better.  Avoid any activities that bring on chest pain.  Do not use any tobacco products, including cigarettes, chewing tobacco, or electronic cigarettes. If you need help quitting, ask your health care provider.  Do not drink alcohol.  Take medicines only as directed by your health care provider.  Keep all follow-up visits as directed by your health care provider. This is important. This includes any further testing if your chest pain does not go away.  If heartburn is the cause for your chest pain, you may be told to keep your head raised (elevated) while sleeping. This reduces the chance that acid will go from your stomach into your esophagus.  Make lifestyle changes as directed by your health care provider. These may include:  Getting regular exercise. Ask your health care provider to suggest some activities that are safe for you.  Eating a heart-healthy diet. A registered dietitian can help you to learn healthy eating options.  Maintaining a healthy weight.  Managing diabetes, if necessary.  Reducing stress. SEEK MEDICAL CARE IF:  Your chest pain does not go away after treatment.  You have a rash with blisters on your chest.  You have a fever. SEEK IMMEDIATE MEDICAL CARE IF:   Your chest pain is worse.  You have an increasing cough, or you cough up blood.  You have severe abdominal pain.  You have severe weakness.  You faint.  You have chills.  You have sudden, unexplained chest discomfort.  You have sudden, unexplained discomfort in your arms, back, neck, or jaw.  You have shortness of breath at any time.  You suddenly start to sweat, or your skin gets clammy.  You feel nauseous or you vomit.  You suddenly feel light-headed or dizzy.  Your heart begins to beat quickly, or it feels like it is skipping beats. These symptoms may represent a serious problem that is an  emergency. Do not wait to see if the symptoms will go away. Get medical help right away. Call your local emergency services (911 in the [place] Do not drive yourself to the hospital.   This information is not intended to replace advice given to you by your health care provider. Make sure you discuss any questions you have with your health care provider.   Document Released: 03/05/2005 Document Revised: 06/16/2014 Document Reviewed: 12/30/2013 Elsevier Interactive Patient Education [employer]2016 [employer]

## 2015-12-28 NOTE — ED Provider Notes (Signed)
Shane Wheeler: [id number]     Arrival date & time 12/28/15  0730 History   First MD Initiated Contact with Patient 12/28/15 [phone]     Chief Complaint  Patient presents with  . Chest Pain     (Consider location/radiation/quality/duration/timing/severity/associated sxs/prior Treatment) HPI 30 year old male who presents with chest pain. History of ADHD and asthma. States that at 7:30 AM, while playing video had sudden onset of left sided chest pressure with diaphoresis. Not had similar pain before. No associating n/v/d, abd pain, fever chills, cough, dyspnea. Mild lightheadedness. No exertional symptoms. No LE edema, calf tenderness, family or personal history of DVt/PE, or recent immbolization. Mother with heart problems, but he is unsure of what age. Drinks 2 five hour energy drinks daily. Last night was up and endorses cocaine abuse and LSD abuse.  Past Medical History  Diagnosis Date  . ADHD (attention deficit hyperactivity disorder)   . Asthma    Past Surgical History  Procedure Laterality Date  . Tonsillectomy    . Appendectomy    . Arm surgery Right    History reviewed. No pertinent family history. Social History  Substance Use Topics  . Smoking status: Current Some Day Smoker    Types: Cigarettes  . Smokeless tobacco: None  . Alcohol Use: Yes     Comment: daily    Review of Systems 10/14 systems reviewed and are negative other than those stated in the HPI    Allergies  Augmentin  Home Medications   Prior to Admission medications   Not on File   BP 130/76 mmHg  Pulse 75  Temp(Src) 97.8 F (36.6 C) (Oral)  Resp 23  Ht  (1.778 m)  Wt 205 lb (92.987 kg)  BMI 29.41 kg/m2  SpO2 96% Physical Exam Physical Exam  Nursing note and vitals reviewed. Constitutional: Well developed, well nourished, non-toxic, and in no acute distress Head: Normocephalic and atraumatic.  Mouth/Throat: Oropharynx is clear and moist.  Neck: Normal range of motion. Neck supple.   Cardiovascular: Tachycardic rate and regular rhythm.   Pulmonary/Chest: Effort normal and breath sounds normal. No LE edema. Abdominal: Soft. There is no tenderness. There is no rebound and no guarding.  Musculoskeletal: Normal range of motion.  Neurological: Alert, no facial droop, fluent speech, moves all extremities symmetrically Skin: Skin is warm and dry.  Psychiatric: Cooperative  ED Course  Procedures (including critical care time) Labs Review Labs Reviewed  BASIC METABOLIC PANEL - Abnormal; Notable for the following:    Potassium 3.2 (*)    Glucose, Bld 100 (*)    All other components within normal limits  CBC - Abnormal; Notable for the following:    WBC 11.0 (*)    All other components within normal limits  MAGNESIUM  DIFFERENTIAL  I-STAT TROPOININ, ED  Rosezena Sensor, ED    Imaging Review Dg Chest 2 View  12/28/2015  CLINICAL DATA:  Left chest pain EXAM: CHEST  2 VIEW COMPARISON:  01/21/2013 FINDINGS: Lungs are clear.  No pleural effusion or pneumothorax. The heart is normal in size. Visualized osseous structures are within normal limits. IMPRESSION: Normal chest radiographs. Electronically Signed   By: Charline Bills M.D.   On: 12/28/2015 08:18   I have personally reviewed and evaluated these images and lab results as part of my medical decision-making.   EKG Interpretation   Date/Time:  Friday December 28 2015 10:39:22 EDT Ventricular Rate:  76 PR Interval:    QRS Duration: 93 QT Interval:  396  QTC Calculation: 446 R Axis:   38 Text Interpretation:  Sinus rhythm No acute ischemic changes  Confirmed by  Jhair Witherington MD, Saketh Daubert ([id number]) on 12/28/2015 11:11:52 AM      MDM   Final diagnoses:  Chest pain, unspecified chest pain type    30 year old male with history of asthma who presents with cocaine induced chest pain. Initially tachycardic and anxious. Cardiopulmonary exam otherwise unremarkable. EKG without acute infarction. Serial tropnin negative and serial EKG  w/o dynamic changes. Chest x-ray shows no acute cardiopulmonary processes. No concerns for PE or dissection at this time. Given IV fluids and Ativan, with resolution of tachycardia and resolution of this chest pain. States that he feels improved. Discussed abstinence from illicit drug use. Discussed strict return and follow-up instructions. He expressed understanding of all discharge instructions, and felt comfortable with the plan of care.    Shane Guiseana Duo Jaycie Kregel, MD 12/28/15 [phone]

## 2015-12-28 NOTE — ED Notes (Signed)
Patient transported to X-ray 

## 2015-12-28 NOTE — ED Notes (Signed)
MD at bedside. 

## 2015-12-28 NOTE — ED Notes (Signed)
Patient reports that he was sitting on his couch playing video games and began to have severe left-sided chest pain. Denies shortness of breath, nausea, vomiting, back pain, or any injury to this area.

## 2015-12-28 NOTE — ED Notes (Addendum)
Patient reports using LSD and cocaine throughout the night. MD aware.

## 2016-01-06 ENCOUNTER — Telehealth (HOSPITAL_BASED_OUTPATIENT_CLINIC_OR_DEPARTMENT_OTHER): Payer: Self-pay

## 2016-02-08 ENCOUNTER — Emergency Department (HOSPITAL_COMMUNITY)
Admission: EM | Admit: 2016-02-08 | Discharge: 2016-02-08 | Disposition: A | Discharge status: Home / Self Care | Payer: Self-pay | Attending: Emergency Medicine | Admitting: Emergency Medicine

## 2016-02-08 ENCOUNTER — Encounter (HOSPITAL_COMMUNITY): Payer: Self-pay | Admitting: Emergency Medicine

## 2016-02-08 ENCOUNTER — Emergency Department (HOSPITAL_COMMUNITY): Payer: Self-pay

## 2016-02-08 DIAGNOSIS — R0789 Other chest pain: Secondary | ICD-10-CM

## 2016-02-08 DIAGNOSIS — J45909 Unspecified asthma, uncomplicated: Secondary | ICD-10-CM | POA: Insufficient documentation

## 2016-02-08 DIAGNOSIS — K219 Gastro-esophageal reflux disease without esophagitis: Secondary | ICD-10-CM | POA: Insufficient documentation

## 2016-02-08 DIAGNOSIS — F909 Attention-deficit hyperactivity disorder, unspecified type: Secondary | ICD-10-CM | POA: Insufficient documentation

## 2016-02-08 DIAGNOSIS — F17210 Nicotine dependence, cigarettes, uncomplicated: Secondary | ICD-10-CM | POA: Insufficient documentation

## 2016-02-08 LAB — CBC
HEMATOCRIT: 44.9 % (ref 39.0–52.0)
HEMOGLOBIN: 16.5 g/dL (ref 13.0–17.0)
MCH: 31.4 pg (ref 26.0–34.0)
MCHC: 36.7 g/dL — AB (ref 30.0–36.0)
MCV: 85.5 fL (ref 78.0–100.0)
Platelets: 235 10*3/uL (ref 150–400)
RBC: 5.25 MIL/uL (ref 4.22–5.81)
RDW: 11.9 % (ref 11.5–15.5)
WBC: 11 10*3/uL — ABNORMAL HIGH (ref 4.0–10.5)

## 2016-02-08 LAB — BASIC METABOLIC PANEL
ANION GAP: 8 (ref 5–15)
BUN: 9 mg/dL (ref 6–20)
CHLORIDE: 103 mmol/L (ref 101–111)
CO2: 25 mmol/L (ref 22–32)
Calcium: 9.6 mg/dL (ref 8.9–10.3)
Creatinine, Ser: 0.88 mg/dL (ref 0.61–1.24)
GFR calc Af Amer: >60 mL/min (ref >60)
GFR calc non Af Amer: >60 mL/min (ref >60)
GLUCOSE: 89 mg/dL (ref 65–99)
POTASSIUM: 4.2 mmol/L (ref 3.5–5.1)
Sodium: 136 mmol/L (ref 135–145)

## 2016-02-08 LAB — I-STAT TROPONIN, ED: Troponin i, poc: 0 ng/mL (ref 0.00–0.08)

## 2016-02-08 MED ORDER — OMEPRAZOLE 40 MG PO CPDR: 40.0000 mg | DELAYED_RELEASE_CAPSULE | Freq: Every day | ORAL | 0 refills | Status: DC

## 2016-02-08 MED ORDER — PANTOPRAZOLE SODIUM 40 MG PO TBEC
40.0000 mg | DELAYED_RELEASE_TABLET | Freq: Once | ORAL | Status: AC
Start: 2016-02-08 — End: 2016-02-08
  Administered 2016-02-08: 40 mg via ORAL
  Filled 2016-02-08: qty 1

## 2016-02-08 NOTE — ED Provider Notes (Signed)
[facility]-EMERGENCY DEPT Provider Note   [name]: [id number] Arrival date & time: 02/08/16  0141     History   Chief Complaint Chief Complaint  Patient presents with  . Chest Pain    HPI Shane Wheeler is a 30 y.o. 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 He is here with chest pain that has been present for the past month. The pain is located primarily in the left upper chest. He describes it as a pressure. It is constant and almost always present. It is not significantly changed with movement or breathing. Symptoms are mild to moderate. He denies shortness of breath, nausea, vomiting, diarrhea or cough. He has been having acid reflux but has not taken any medications for this.  HPI  Past Medical History:  Diagnosis Date  . ADHD (attention deficit hyperactivity disorder)   . Asthma     There are no active problems to display for this patient.   Past Surgical History:  Procedure Laterality Date  . APPENDECTOMY    . arm surgery Right   . TONSILLECTOMY         Home Medications    Prior to Admission medications   Medication Sig Start Date End Date Taking? Authorizing Provider  omeprazole (PRILOSEC) 40 MG capsule Take 1 capsule (40 mg total) by mouth daily. 02/08/16   Paula Libra, MD    Family History History reviewed. No pertinent family history.  Social History Social History  Substance Use Topics  . Smoking status: Current Some Day Smoker    Types: Cigarettes  . Smokeless tobacco: Never Used  . Alcohol use Yes     Comment: daily     Allergies   Augmentin [amoxicillin-pot clavulanate]   Review of Systems Review of Systems  All other systems reviewed and are negative.    Physical Exam Updated Vital Signs BP 118/70 (BP Location: Left Arm)   Pulse 67   Temp 98.6 F (37 C) (Oral)   Resp 11   SpO2 98%   Physical Exam General: Well-developed, well-nourished male in no acute distress; appearance consistent with  age of record HENT: normocephalic; atraumatic Eyes: pupils equal, round and reactive to light; extraocular muscles intact Neck: supple Heart: regular rate and rhythm; no murmurs, rubs or gallops Lungs: clear to auscultation bilaterally Chest: Chest wall tenderness left greater than right, patient equivocal about whether this is the same as the pain of the chief complaint. Abdomen: soft; nondistended; nontender; no masses or hepatosplenomegaly; bowel sounds present Extremities: No deformity; full range of motion; pulses normal Neurologic: Awake, alert and oriented; motor function intact in all extremities and symmetric; no facial droop Skin: Warm and dry Psychiatric: Normal mood and affect    ED Treatments / Results   Nursing notes and vitals signs, including pulse oximetry, reviewed.  Summary of this visit's results, reviewed by myself:  Labs:  Results for orders placed or performed during the hospital encounter of 02/08/16 (from the past 24 hour(s))  Basic metabolic panel     Status: None   Collection Time: 02/08/16  2:20 AM  Result Value Ref Range   Sodium 136 135 - 145 mmol/L   Potassium 4.2 3.5 - 5.1 mmol/L   Chloride 103 101 - 111 mmol/L   CO2 25 22 - 32 mmol/L   Glucose, Bld 89 65 - 99 mg/dL   BUN 9 6 - 20 mg/dL   Creatinine, Ser 4.09 0.61 - 1.24 mg/dL  Calcium 9.6 8.9 - 10.3 mg/dL   GFR calc non Af Amer >60 >60 mL/min   GFR calc Af Amer >60 >60 mL/min   Anion gap 8 5 - 15  CBC     Status: Abnormal   Collection Time: 02/08/16  2:20 AM  Result Value Ref Range   WBC 11.0 (H) 4.0 - 10.5 K/uL   RBC 5.25 4.22 - 5.81 MIL/uL   Hemoglobin 16.5 13.0 - 17.0 g/dL   HCT 95.644.9 38.739.0 - 56.452.0 %   MCV 85.5 78.0 - 100.0 fL   MCH 31.4 26.0 - 34.0 pg   MCHC 36.7 (H) 30.0 - 36.0 g/dL   RDW 33.211.9 95.111.5 - 88.415.5 %   Platelets 235 150 - 400 K/uL  I-stat troponin, ED     Status: None   Collection Time: 02/08/16  2:31 AM  Result Value Ref Range   Troponin i, poc 0.00 0.00 - 0.08 ng/mL    Comment 3            Imaging Studies: Dg Chest 2 View  Result Date: 02/08/2016 CLINICAL DATA:  Chest pain for over 1 week, worse tonight. EXAM: CHEST  2 VIEW COMPARISON:  12/28/2015 FINDINGS: The lungs are clear. The pulmonary vasculature is normal. Heart size is normal. Hilar and mediastinal contours are unremarkable. There is no pleural effusion. IMPRESSION: No active cardiopulmonary disease. Electronically Signed   By: Ellery Plunkaniel R Mitchell M.D.   On: 02/08/2016 02:18    EKG  EKG Interpretation  Date/Time:  Friday February 08 2016 01:47:40 EDT Ventricular Rate:  71 PR Interval:    QRS Duration: 88 QT Interval:  394 QTC Calculation: 429 R Axis:   33 Text Interpretation:  Sinus rhythm Baseline wander in lead(s) V1 No significant change was found Confirmed by Ata Pecha  MD, Jonny RuizJOHN ([id number]) on 02/08/2016 2:54:23 AM      3:15 AM The patient's pain does not seem consistent with coronary etiology. We will treat him with a PPI for acid reflux. He was also advised that there may be a chest wall component and ibuprofen or Aleve would be appropriate to treat that. He was advised that NSAIDs can exacerbate acid reflux so he should be sure to take his PPI if taking an NSAID.  Procedures (including critical care time)   Final Clinical Impressions(s) / ED Diagnoses   Final diagnoses:  Atypical chest pain  Gastroesophageal reflux disease, esophagitis presence not specified      Paula LibraJohn Garrit Marrow, MD 02/08/16 [phone]

## 2016-02-08 NOTE — ED Triage Notes (Signed)
Pt presents to ED with L sided chest pain. Pt states that he was seen 3 weeks ago after "partying too hard." Pt states that he last used cocaine 3 weeks ago, but the chest pain has not subsided since he was discharged last time. A&Ox4.

## 2016-05-18 ENCOUNTER — Encounter (HOSPITAL_COMMUNITY): Payer: Self-pay

## 2016-05-18 ENCOUNTER — Emergency Department (HOSPITAL_COMMUNITY): Payer: Self-pay

## 2016-05-18 ENCOUNTER — Emergency Department (HOSPITAL_COMMUNITY)
Admission: EM | Admit: 2016-05-18 | Discharge: 2016-05-18 | Disposition: A | Discharge status: Home / Self Care | Payer: Self-pay | Attending: Emergency Medicine | Admitting: Emergency Medicine

## 2016-05-18 DIAGNOSIS — J45909 Unspecified asthma, uncomplicated: Secondary | ICD-10-CM | POA: Insufficient documentation

## 2016-05-18 DIAGNOSIS — Z87891 Personal history of nicotine dependence: Secondary | ICD-10-CM | POA: Insufficient documentation

## 2016-05-18 DIAGNOSIS — F909 Attention-deficit hyperactivity disorder, unspecified type: Secondary | ICD-10-CM | POA: Insufficient documentation

## 2016-05-18 DIAGNOSIS — R1032 Left lower quadrant pain: Secondary | ICD-10-CM | POA: Insufficient documentation

## 2016-05-18 LAB — COMPREHENSIVE METABOLIC PANEL
ALT: 32 U/L (ref 17–63)
AST: 27 U/L (ref 15–41)
Albumin: 5 g/dL (ref 3.5–5.0)
Alkaline Phosphatase: 75 U/L (ref 38–126)
Anion gap: 9 (ref 5–15)
BILIRUBIN TOTAL: 0.7 mg/dL (ref 0.3–1.2)
BUN: 17 mg/dL (ref 6–20)
CO2: 24 mmol/L (ref 22–32)
CREATININE: 0.85 mg/dL (ref 0.61–1.24)
Calcium: 9.2 mg/dL (ref 8.9–10.3)
Chloride: 103 mmol/L (ref 101–111)
GFR calc Af Amer: >60 mL/min (ref >60)
Glucose, Bld: 94 mg/dL (ref 65–99)
POTASSIUM: 3.8 mmol/L (ref 3.5–5.1)
Sodium: 136 mmol/L (ref 135–145)
TOTAL PROTEIN: 7.4 g/dL (ref 6.5–8.1)

## 2016-05-18 LAB — CBC
HCT: 46 % (ref 39.0–52.0)
Hemoglobin: 16.4 g/dL (ref 13.0–17.0)
MCH: 31.5 pg (ref 26.0–34.0)
MCHC: 35.7 g/dL (ref 30.0–36.0)
MCV: 88.3 fL (ref 78.0–100.0)
PLATELETS: 226 10*3/uL (ref 150–400)
RBC: 5.21 MIL/uL (ref 4.22–5.81)
RDW: 12.1 % (ref 11.5–15.5)
WBC: 9.1 10*3/uL (ref 4.0–10.5)

## 2016-05-18 LAB — URINALYSIS, ROUTINE W REFLEX MICROSCOPIC
Bilirubin Urine: NEGATIVE
Glucose, UA: NEGATIVE mg/dL
Hgb urine dipstick: NEGATIVE
KETONES UR: 5 mg/dL — AB
LEUKOCYTES UA: NEGATIVE
NITRITE: NEGATIVE
PROTEIN: NEGATIVE mg/dL
Specific Gravity, Urine: 1.018 (ref 1.005–1.030)
pH: 5 (ref 5.0–8.0)

## 2016-05-18 LAB — LIPASE, BLOOD: Lipase: 36 U/L (ref 11–51)

## 2016-05-18 MED ORDER — SODIUM CHLORIDE 0.9 % IJ SOLN
INTRAMUSCULAR | Status: AC
  Filled 2016-05-18: qty 50

## 2016-05-18 MED ORDER — IOPAMIDOL (ISOVUE-300) INJECTION 61%
INTRAVENOUS | Status: AC
  Administered 2016-05-18: 100 mL
  Filled 2016-05-18: qty 100

## 2016-05-18 NOTE — ED Notes (Signed)
Forgot to have pt esign

## 2016-05-18 NOTE — ED Notes (Signed)
PA at bedside.

## 2016-05-18 NOTE — Discharge Instructions (Signed)
Please schedule appointment with [facility] in 2-5 days for follow-up.  Get help right away if: Your pain does not go away as soon as your health care provider told you to expect. You cannot stop throwing up. Your pain is only in areas of the abdomen, such as the right side or the left lower portion of the abdomen. You have bloody or black stools, or stools that look like tar. You have severe pain, cramping, or bloating in your abdomen. You have signs of dehydration, such as: Dark urine, very little urine, or no urine. Cracked lips. Dry mouth. Sunken eyes. Sleepiness. Weakness.

## 2016-05-18 NOTE — ED Triage Notes (Addendum)
Pt c/o constant LLQ pressure x 1 week w/ intermittent pain x 1 week.  Pain score 3/10.  Pt reports palpation and "certain movements" increase pain.  Denies n/v/d.     Pt reports that he abruptly quit several substances x 1-2 month ago.  Pt admits to heavy marijuana use.

## 2016-05-18 NOTE — ED Notes (Signed)
Pt sts that he is not satisfied with his findings and request to speak to PA. Shane Wheeler made aware

## 2016-05-18 NOTE — ED Provider Notes (Signed)
[facility]ER[facility] DEPT Provider Note   [name]: [id number] Arrival date & time: 05/18/16  [id number]     History   Chief Complaint Chief Complaint  Patient presents with  . Abdominal Pain    HPI Shane Wheeler is a 30 y.o. male with history of appendectomy and surgical removal of Meckel's diverticulum at age 30 presents with left lower quadrant pain intermittently for one week. He states it is tender to palpation and hurts more on certain movements such as twisting. He states his pain is achy, and currently a 3/10. He states that he is tried Tylenol with no relief. He states he's never had anything like this before. He denies any bulge in the area or in the scrotum. Denies scrotal tenderness or swelling. Denies recent travel, change in diet, fever, chills, nausea, vomiting, diarrhea, constipation, painful urination, hematuria, chest pain, shortness of breath.  HPI  Past Medical History:  Diagnosis Date  . ADHD (attention deficit hyperactivity disorder)   . Asthma     There are no active problems to display for this patient.   Past Surgical History:  Procedure Laterality Date  . APPENDECTOMY    . arm surgery Right   . TONSILLECTOMY         Home Medications    Prior to Admission medications   Not on File    Family History History reviewed. No pertinent family history.  Social History Social History  Substance Use Topics  . Smoking status: Former Smoker    Types: Cigarettes  . Smokeless tobacco: Never Used  . Alcohol use Yes     Comment: daily     Allergies   Augmentin [amoxicillin-pot clavulanate]   Review of Systems Review of Systems  Constitutional: Negative for appetite change, chills and fever.  Eyes: Negative for visual disturbance.  Respiratory: Negative for cough and shortness of breath.   Cardiovascular: Negative for chest pain and palpitations.  Gastrointestinal: Positive for abdominal pain (LLQ). Negative for blood in stool, constipation,  diarrhea, nausea and vomiting.  Genitourinary: Negative for difficulty urinating, discharge, dysuria, hematuria, penile pain, penile swelling, scrotal swelling and testicular pain.  Musculoskeletal: Negative for arthralgias and back pain.  Skin: Negative for color change and rash.  Neurological: Negative for seizures and syncope.  All other systems reviewed and are negative.    Physical Exam Updated Vital Signs BP 128/83 (BP Location: Left Arm)   Pulse 64   Temp 97.9 F (36.6 C) (Oral)   Resp 20   SpO2 100%   Physical Exam  Constitutional: He is oriented to person, place, and time. He appears well-developed and well-nourished.  HENT:  Head: Normocephalic and atraumatic.  Nose: Nose normal.  Eyes: Conjunctivae and EOM are normal. Pupils are equal, round, and reactive to light.  Neck: Normal range of motion. Neck supple.  Cardiovascular: Normal rate and normal heart sounds.   Pulmonary/Chest: Effort normal and breath sounds normal. No respiratory distress. He exhibits no tenderness.  Abdominal: Soft. Bowel sounds are normal. He exhibits no distension. There is tenderness (LLQ). There is no rebound and no guarding. Hernia confirmed negative in the right inguinal area and confirmed negative in the left inguinal area.  Genitourinary: Testes normal and penis normal. Cremasteric reflex is present. Right testis shows no mass, no swelling and no tenderness. Left testis shows no mass, no swelling and no tenderness. Circumcised.  Musculoskeletal: Normal range of motion.  Lymphadenopathy: No inguinal adenopathy noted on the right or left side.  Neurological: He is alert and  oriented to person, place, and time.  Skin: Skin is warm. Capillary refill takes less than 2 seconds.  Psychiatric: He has a normal mood and affect. His behavior is normal.  Nursing note and vitals reviewed.    ED Treatments / Results  Labs (all labs ordered are listed, but only abnormal results are displayed) Labs  Reviewed  URINALYSIS, ROUTINE W REFLEX MICROSCOPIC - Abnormal; Notable for the following:       Result Value   Ketones, ur 5 (*)    All other components within normal limits  LIPASE, BLOOD  COMPREHENSIVE METABOLIC PANEL  CBC    EKG  EKG Interpretation None       Radiology Ct Abdomen Pelvis W Contrast  Result Date: 05/18/2016 CLINICAL DATA:  Left lower quadrant pressure with intermittent pain 1 week. EXAM: CT ABDOMEN AND PELVIS WITH CONTRAST TECHNIQUE: Multidetector CT imaging of the abdomen and pelvis was performed using the standard protocol following bolus administration of intravenous contrast. CONTRAST:  100mL ISOVUE-300 IOPAMIDOL (ISOVUE-300) INJECTION 61% COMPARISON:  03/02/2014 FINDINGS: Lower chest: Lung bases are within normal. Hepatobiliary: Gallbladder, liver and biliary tree are normal. Pancreas: Within normal. Spleen: Within normal. Adrenals/Urinary Tract: Adrenal glands are normal. Kidneys are normal. Ureters and bladder are normal. Stomach/Bowel: Stomach and small bowel are normal. Previous appendectomy. Colon is within normal. Vascular/Lymphatic: Vascular structures are within normal. No evidence of adenopathy. Reproductive: Within normal. Other: No free fluid or focal inflammatory change. Musculoskeletal: Bones soft tissues are within normal. IMPRESSION: No acute findings in the abdomen/pelvis. Electronically Signed   By: Elberta Fortisaniel  Boyle M.D.   On: 05/18/2016 14:06    Procedures Procedures (including critical care time)  Medications Ordered in ED Medications  iopamidol (ISOVUE-300) 61 % injection (100 mLs  Contrast Given 05/18/16 1338)     Initial Impression / Assessment and Plan / ED Course  I have reviewed the triage vital signs and the nursing notes.  Pertinent labs & imaging results that were available during my care of the patient were reviewed by me and considered in my medical decision making (see chart for details).  Clinical Course   Patient is a  30 year old male residing with left lower quadrant pain 1 week. He reports having one abdominal surgery at age 37 for Meckel's diverticulum and appendectomy in same procedure. He's never had this pain before. Denies fever, chills, bulge to the area or in scrotum. On exam patient is afebrile, NAD, VSS. Heart and lung sounds are clear. Abdomen is soft. Negative Murphy sign. No focal tenderness at McBurney's point. No guarding or rebound. Testicular exam is unremarkable with no obvious sign of hernia, mass, swelling, erythema. CT negative for any acute findings. Lab work is unremarkable. Plan is to discharge and follow-up with [facility] in 2-5 days. Patient understood. Patient is NAD, VSS, afebrile and okay for discharge. Return precautions given for any new or worsening symptoms such as chest pain, shortness of breath, fever, chills, nausea, vomiting, diarrhea.  Final Clinical Impressions(s) / ED Diagnoses   Final diagnoses:  Left lower quadrant pain    New Prescriptions There are no discharge medications for this patient.    [address]Minka Knight Manuel [place], [place] 05/18/16 2134    Lyndal Pulleyaniel Knott, MD 05/19/16 [phone]

## 2016-07-15 ENCOUNTER — Encounter (HOSPITAL_COMMUNITY): Payer: Self-pay | Admitting: *Deleted

## 2016-07-15 ENCOUNTER — Emergency Department (HOSPITAL_COMMUNITY): Payer: Self-pay

## 2016-07-15 ENCOUNTER — Emergency Department (HOSPITAL_COMMUNITY)
Admission: EM | Admit: 2016-07-15 | Discharge: 2016-07-15 | Disposition: A | Discharge status: Home / Self Care | Payer: Self-pay | Attending: Emergency Medicine | Admitting: Emergency Medicine

## 2016-07-15 DIAGNOSIS — Z87891 Personal history of nicotine dependence: Secondary | ICD-10-CM | POA: Insufficient documentation

## 2016-07-15 DIAGNOSIS — R519 Headache, unspecified: Secondary | ICD-10-CM

## 2016-07-15 DIAGNOSIS — R51 Headache: Secondary | ICD-10-CM | POA: Insufficient documentation

## 2016-07-15 DIAGNOSIS — Z7982 Long term (current) use of aspirin: Secondary | ICD-10-CM | POA: Insufficient documentation

## 2016-07-15 DIAGNOSIS — J45909 Unspecified asthma, uncomplicated: Secondary | ICD-10-CM | POA: Insufficient documentation

## 2016-07-15 MED ORDER — KETOROLAC TROMETHAMINE 60 MG/2ML IM SOLN
60.0000 mg | Freq: Once | INTRAMUSCULAR | Status: AC
  Administered 2016-07-15: 60 mg via INTRAMUSCULAR
  Filled 2016-07-15: qty 2

## 2016-07-15 NOTE — ED Triage Notes (Signed)
Pt states that he has been "clean living" for the last 2 months; pt states that he drank heavily 4 days ago and woke up with a headache the morning after; pt states that he still continues to have a dull headache and states "I don't think I am thinking as fast and clearly as I was"; pt alert and oriented and answers all questions appropriately; pt states that he feels like the vision in his right eye gets blurry at times but denies blurry vision at present; pt states "I think I may just be overthinking it"

## 2016-07-15 NOTE — Discharge Instructions (Signed)
Your CT scan is normal. Take tylenol or motrin for pain. Rest. Follow up as needed. Return if worsening symptoms.

## 2016-07-15 NOTE — ED Provider Notes (Signed)
[facility]ER[facility] DEPT Provider Note   [name]: [id number] Arrival date & time: 07/15/16  0201     History   Chief Complaint Chief Complaint  Patient presents with  . Headache    HPI Shane Wheeler is a 31 y.o. male.  HPI Shane Wheeler is a 31 y.o. male with hx of ADHD, presents to ed complaining of a daily headache for a week. States headache started after drinking, and initially thought it was a hang over. States he used to drink heavily and do drugs but has been clean for 2 months prior to this episode. States headache is in the same spot, on the right side. Reports associated intermittent blurred vision, confusion, dysarthria. States he was playing video games earlier and could not focus which is what brought him to ED. Denies head injury, although states he was heavily intoxicated. Denies nausea, vomiting, dizziness. Not on blood thinners. Took aspirin with some relief.   Past Medical History:  Diagnosis Date  . ADHD (attention deficit hyperactivity disorder)   . Asthma     There are no active problems to display for this patient.   Past Surgical History:  Procedure Laterality Date  . APPENDECTOMY    . arm surgery Right   . TONSILLECTOMY         Home Medications    Prior to Admission medications   Medication Sig Start Date End Date Taking? Authorizing Provider  aspirin EC 325 MG tablet Take 325 mg by mouth every 4 (four) hours as needed for mild pain.   Yes Historical Provider, MD    Family History No family history on file.  Social History Social History  Substance Use Topics  . Smoking status: Former Smoker    Types: Cigarettes  . Smokeless tobacco: Never Used  . Alcohol use Yes     Comment: daily     Allergies   Augmentin [amoxicillin-pot clavulanate]   Review of Systems Review of Systems  Constitutional: Negative for chills and fever.  Eyes: Positive for visual disturbance.  Respiratory: Negative for cough, chest tightness and  shortness of breath.   Cardiovascular: Negative for chest pain, palpitations and leg swelling.  Gastrointestinal: Negative for abdominal distention, abdominal pain, diarrhea, nausea and vomiting.  Musculoskeletal: Negative for arthralgias, myalgias, neck pain and neck stiffness.  Skin: Negative for rash.  Allergic/Immunologic: Negative for immunocompromised state.  Neurological: Positive for speech difficulty and headaches. Negative for dizziness, weakness, light-headedness and numbness.  Psychiatric/Behavioral: The patient is not nervous/anxious.   All other systems reviewed and are negative.    Physical Exam Updated Vital Signs BP 131/73   Pulse 65   Temp 97.8 F (36.6 C) (Oral)   Resp 18   SpO2 98%   Physical Exam  Constitutional: He is oriented to person, place, and time. He appears well-developed and well-nourished. No distress.  HENT:  Head: Normocephalic and atraumatic.  Eyes: Conjunctivae and EOM are normal. Pupils are equal, round, and reactive to light.  Neck: Normal range of motion. Neck supple.  Cardiovascular: Normal rate, regular rhythm and normal heart sounds.   Pulmonary/Chest: Effort normal. No respiratory distress. He has no wheezes. He has no rales.  Abdominal: Soft. Bowel sounds are normal. He exhibits no distension. There is no tenderness. There is no rebound.  Musculoskeletal: He exhibits no edema.  Neurological: He is alert and oriented to person, place, and time. No cranial nerve deficit. Coordination normal.  5/5 and equal upper and lower extremity strength bilaterally. Equal grip strength  bilaterally. Normal finger to nose and heel to shin. No pronator drift.   Skin: Skin is warm and dry.  Nursing note and vitals reviewed.    ED Treatments / Results  Labs (all labs ordered are listed, but only abnormal results are displayed) Labs Reviewed - No data to display  EKG  EKG Interpretation None       Radiology No results  found.  Procedures Procedures (including critical care time)  Medications Ordered in ED Medications  ketorolac (TORADOL) injection 60 mg (not administered)     Initial Impression / Assessment and Plan / ED Course  I have reviewed the triage vital signs and the nursing notes.  Pertinent labs & imaging results that were available during my care of the patient were reviewed by me and considered in my medical decision making (see chart for details).     Pt with persistent headache for a week, in setting of visual changes, dysarthria, possible head injury while intoxicated, CT head ordered.   7:32 AM Ct negative. Pt has no complaints at this time. Neurovascularly intact. Home with tylenol/motrin. Follow up with pcp.   Vitals:   07/15/16 0214 07/15/16 0538  BP: 140/93 131/73  Pulse: 88 65  Resp: 18 18  Temp: 97.8 F (36.6 C)   TempSrc: Oral   SpO2: 100% 98%       Final Clinical Impressions(s) / ED Diagnoses   Final diagnoses:  Nonintractable headache, unspecified chronicity pattern, unspecified headache type    New Prescriptions New Prescriptions   No medications on file     Jaynie Crumble, PA-C 07/15/16 1610

## 2016-08-31 ENCOUNTER — Emergency Department (HOSPITAL_COMMUNITY)
Admission: EM | Admit: 2016-08-31 | Discharge: 2016-08-31 | Disposition: A | Discharge status: Home / Self Care | Payer: Self-pay | Attending: Emergency Medicine | Admitting: Emergency Medicine

## 2016-08-31 ENCOUNTER — Encounter (HOSPITAL_COMMUNITY): Payer: Self-pay | Admitting: Oncology

## 2016-08-31 ENCOUNTER — Emergency Department (HOSPITAL_COMMUNITY): Payer: Self-pay

## 2016-08-31 DIAGNOSIS — R1032 Left lower quadrant pain: Secondary | ICD-10-CM

## 2016-08-31 DIAGNOSIS — Z87891 Personal history of nicotine dependence: Secondary | ICD-10-CM | POA: Insufficient documentation

## 2016-08-31 DIAGNOSIS — F909 Attention-deficit hyperactivity disorder, unspecified type: Secondary | ICD-10-CM | POA: Insufficient documentation

## 2016-08-31 DIAGNOSIS — F1210 Cannabis abuse, uncomplicated: Secondary | ICD-10-CM | POA: Insufficient documentation

## 2016-08-31 DIAGNOSIS — Z79899 Other long term (current) drug therapy: Secondary | ICD-10-CM | POA: Insufficient documentation

## 2016-08-31 DIAGNOSIS — J45909 Unspecified asthma, uncomplicated: Secondary | ICD-10-CM | POA: Insufficient documentation

## 2016-08-31 DIAGNOSIS — K5900 Constipation, unspecified: Secondary | ICD-10-CM | POA: Insufficient documentation

## 2016-08-31 LAB — COMPREHENSIVE METABOLIC PANEL
ALK PHOS: 73 U/L (ref 38–126)
ALT: 21 U/L (ref 17–63)
ANION GAP: 7 (ref 5–15)
AST: 27 U/L (ref 15–41)
Albumin: 4.9 g/dL (ref 3.5–5.0)
BUN: 10 mg/dL (ref 6–20)
CALCIUM: 9.5 mg/dL (ref 8.9–10.3)
CO2: 27 mmol/L (ref 22–32)
CREATININE: 0.81 mg/dL (ref 0.61–1.24)
Chloride: 104 mmol/L (ref 101–111)
Glucose, Bld: 94 mg/dL (ref 65–99)
Potassium: 3.8 mmol/L (ref 3.5–5.1)
SODIUM: 138 mmol/L (ref 135–145)
TOTAL PROTEIN: 7.5 g/dL (ref 6.5–8.1)
Total Bilirubin: 0.4 mg/dL (ref 0.3–1.2)

## 2016-08-31 LAB — LIPASE, BLOOD: LIPASE: 34 U/L (ref 11–51)

## 2016-08-31 LAB — URINALYSIS, ROUTINE W REFLEX MICROSCOPIC
Bilirubin Urine: NEGATIVE
GLUCOSE, UA: NEGATIVE mg/dL
Hgb urine dipstick: NEGATIVE
Ketones, ur: NEGATIVE mg/dL
LEUKOCYTES UA: NEGATIVE
NITRITE: NEGATIVE
PH: 7 (ref 5.0–8.0)
PROTEIN: NEGATIVE mg/dL
Specific Gravity, Urine: 1.002 — ABNORMAL LOW (ref 1.005–1.030)

## 2016-08-31 LAB — CBC
HEMATOCRIT: 43.7 % (ref 39.0–52.0)
Hemoglobin: 15.7 g/dL (ref 13.0–17.0)
MCH: 31.2 pg (ref 26.0–34.0)
MCHC: 35.9 g/dL (ref 30.0–36.0)
MCV: 86.7 fL (ref 78.0–100.0)
PLATELETS: 244 10*3/uL (ref 150–400)
RBC: 5.04 MIL/uL (ref 4.22–5.81)
RDW: 12.2 % (ref 11.5–15.5)
WBC: 9.7 10*3/uL (ref 4.0–10.5)

## 2016-08-31 MED ORDER — BISACODYL 5 MG PO TBEC: DELAYED_RELEASE_TABLET | ORAL | 0 refills | Status: DC

## 2016-08-31 MED ORDER — LACTULOSE 20 GM/30ML PO SOLN: 20.0000 g | Freq: Every day | ORAL | 0 refills | Status: AC

## 2016-08-31 NOTE — ED Triage Notes (Signed)
Pt presents d/t LLQ abdominal pain x 1 month. Pt has been seen here for the same.  Pt states he has made dietary changes w/o any relief.  D/t financial concerns pt has been unable to follow up.

## 2016-08-31 NOTE — ED Provider Notes (Signed)
[facility]-EMERGENCY DEPT Provider Note   [name]: [id number] Arrival date & time: 08/31/16  0319     History   Chief Complaint Chief Complaint  Patient presents with  . Abdominal Pain    HPI Shane Wheeler is a 31 y.o. male with a hx of ADHD and asthma presents to the Emergency Department complaining of gradual, persistent, progressively worsening lower quadrant abdominal pain onset approximately 4 months ago. Patient reports that pain is primarily in the left lower quadrant and described as a fullness and cramping. He reports some intermittent use of MiraLAX without relief. He does report hard, pellet of stool. Patient also admits to chronic, everyday use of marijuana including smoking it, eating it and other forms of usage.  He reports some associated decreased appetite. Nothing seems to make the symptoms better or worse. He continues to use marijuana every day. He does deny emesis. He denies fever, chills, diarrhea, melena, hematochezia, painful bowel movements. Surgical History includes appendectomy and surgical removal of Meckel's diverticulum at age 89.   Patient reports he has not been evaluated by gastroenterology due to financial concerns.   The history is provided by the patient and medical records. No language interpreter was used.    Past Medical History:  Diagnosis Date  . ADHD (attention deficit hyperactivity disorder)   . Asthma     There are no active problems to display for this patient.   Past Surgical History:  Procedure Laterality Date  . APPENDECTOMY    . arm surgery Right   . TONSILLECTOMY         Home Medications    Prior to Admission medications   Medication Sig Start Date End Date Taking? Authorizing Provider  bisacodyl (DULCOLAX) 5 MG EC tablet Take 15mg  daily for the next 3 days 08/31/16   Dahlia ClientHannah Sayid Moll, PA-C  Lactulose 20 GM/30ML SOLN Take 30 mLs (20 g total) by mouth daily. For 3 days 08/31/16 09/03/16  Dahlia ClientHannah Annasophia Crocker, PA-C    Family  History No family history on file.  Social History Social History  Substance Use Topics  . Smoking status: Former Smoker    Types: Cigarettes  . Smokeless tobacco: Never Used  . Alcohol use Yes     Comment: daily     Allergies   Augmentin [amoxicillin-pot clavulanate]   Review of Systems Review of Systems  Constitutional: Positive for appetite change.  Gastrointestinal: Positive for abdominal pain and constipation.  All other systems reviewed and are negative.    Physical Exam Updated Vital Signs BP 123/73 (BP Location: Right Arm)   Pulse 72   Temp 98.2 F (36.8 C) (Oral)   Resp 20   Ht 5\' 10"  (1.778 m)   Wt 91.6 kg   SpO2 99%   BMI 28.98 kg/m   Physical Exam  Constitutional: He appears well-developed and well-nourished. No distress.  Awake, alert, nontoxic appearance  HENT:  Head: Normocephalic and atraumatic.  Mouth/Throat: Oropharynx is clear and moist. No oropharyngeal exudate.  Eyes: Conjunctivae are normal. No scleral icterus.  Neck: Normal range of motion. Neck supple.  Cardiovascular: Normal rate, regular rhythm and intact distal pulses.   Pulmonary/Chest: Effort normal and breath sounds normal. No respiratory distress. He has no wheezes.  Equal chest expansion  Abdominal: Soft. Bowel sounds are normal. He exhibits no mass. There is tenderness ( minimal) in the left lower quadrant. There is no rebound and no guarding.  Musculoskeletal: Normal range of motion. He exhibits no edema.  Neurological: He is alert.  Speech is clear and goal oriented Moves extremities without ataxia  Skin: Skin is warm and dry. He is not diaphoretic.  Psychiatric: He has a normal mood and affect.  Nursing note and vitals reviewed.    ED Treatments / Results  Labs (all labs ordered are listed, but only abnormal results are displayed) Labs Reviewed  URINALYSIS, ROUTINE W REFLEX MICROSCOPIC - Abnormal; Notable for the following:       Result Value   Color, Urine STRAW  (*)    Specific Gravity, Urine 1.002 (*)    All other components within normal limits  LIPASE, BLOOD  COMPREHENSIVE METABOLIC PANEL  CBC    Radiology Dg Abdomen Acute W/chest  Result Date: 08/31/2016 CLINICAL DATA:  Initial evaluation for acute left lower quadrant abdominal pain. EXAM: DG ABDOMEN ACUTE W/ 1V CHEST COMPARISON:  Prior CT from 05/18/2016. FINDINGS: There is no evidence of dilated bowel loops or free intraperitoneal air. No radiopaque calculi or other significant radiographic abnormality is seen. Heart size and mediastinal contours are within normal limits. Both lungs are clear. IMPRESSION: Negative abdominal radiographs.  No acute cardiopulmonary disease. Electronically Signed   By: Rise Mu M.D.   On: 08/31/2016 06:12    Procedures Procedures (including critical care time)  Medications Ordered in ED Medications - No data to display   Initial Impression / Assessment and Plan / ED Course  I have reviewed the triage vital signs and the nursing notes.  Pertinent labs & imaging results that were available during my care of the patient were reviewed by me and considered in my medical decision making (see chart for details).     Patient presents with persistent left lower quadrant abdominal pain. Record review shows multiple workups including CT scan back in December which was normal. Patient has no family history of colon cancer.  Suspect that patient's pain is a combination of persistent cannabinoid usage and constipation. Labs are reassuring today. Abdomen is soft without rebound or guarding. Highly doubt bowel obstruction.    Plain films without signs of bowel obstruction. Large stool burden noted.  Labs are reassuring. No evidence of UTI. Long discussion with patient about marijuana usage and its role in his chronic constipation. Will give medications to help alleviate some constipation. Patient reports he will stop smoking marijuana. [facility] been [facility] for [facility] and for referral to gastroenterology. Patient states understanding and is in agreement with the plan.  Final Clinical Impressions(s) / ED Diagnoses   Final diagnoses:  Left lower quadrant pain  Constipation, unspecified constipation type  Cannabis abuse, continuous    New Prescriptions New Prescriptions   BISACODYL (DULCOLAX) 5 MG EC TABLET    Take 15mg  daily for the next 3 days   LACTULOSE 20 GM/30ML SOLN    Take 30 mLs (20 g total) by mouth daily. For 3 days     Dierdre Forth, PA-C 08/31/16 1610    Azalia Bilis, MD 09/01/16 [phone]

## 2016-08-31 NOTE — Discharge Instructions (Signed)
1. Medications: Take prescribed medications for 3 days; afterwards switch to MiraLAX, usual home medications 2. Treatment: rest, drink plenty of fluids, advance diet slowly, stop smoking marijuana 3. Follow Up: [facility]up [facility] for discussion of your diagnoses and further evaluation after today's visit; if you do not have a primary care doctor use the resource guide provided to find one; Please return to the ER for persistent vomiting, high fevers or worsening symptoms

## 2016-09-04 ENCOUNTER — Encounter (HOSPITAL_COMMUNITY): Payer: Self-pay

## 2016-09-04 ENCOUNTER — Emergency Department (HOSPITAL_COMMUNITY)
Admission: EM | Admit: 2016-09-04 | Discharge: 2016-09-04 | Disposition: A | Discharge status: Home / Self Care | Payer: Self-pay | Attending: Emergency Medicine | Admitting: Emergency Medicine

## 2016-09-04 ENCOUNTER — Emergency Department (HOSPITAL_COMMUNITY): Payer: Self-pay

## 2016-09-04 DIAGNOSIS — R1032 Left lower quadrant pain: Secondary | ICD-10-CM | POA: Insufficient documentation

## 2016-09-04 DIAGNOSIS — Z87891 Personal history of nicotine dependence: Secondary | ICD-10-CM | POA: Insufficient documentation

## 2016-09-04 DIAGNOSIS — F909 Attention-deficit hyperactivity disorder, unspecified type: Secondary | ICD-10-CM | POA: Insufficient documentation

## 2016-09-04 DIAGNOSIS — R197 Diarrhea, unspecified: Secondary | ICD-10-CM | POA: Insufficient documentation

## 2016-09-04 DIAGNOSIS — J45909 Unspecified asthma, uncomplicated: Secondary | ICD-10-CM | POA: Insufficient documentation

## 2016-09-04 LAB — COMPREHENSIVE METABOLIC PANEL
ALT: 21 U/L (ref 17–63)
AST: 25 U/L (ref 15–41)
Albumin: 5.1 g/dL — ABNORMAL HIGH (ref 3.5–5.0)
Alkaline Phosphatase: 72 U/L (ref 38–126)
Anion gap: 8 (ref 5–15)
BILIRUBIN TOTAL: 0.9 mg/dL (ref 0.3–1.2)
BUN: 6 mg/dL (ref 6–20)
CALCIUM: 9.5 mg/dL (ref 8.9–10.3)
CHLORIDE: 103 mmol/L (ref 101–111)
CO2: 26 mmol/L (ref 22–32)
CREATININE: 0.84 mg/dL (ref 0.61–1.24)
Glucose, Bld: 93 mg/dL (ref 65–99)
Potassium: 3.4 mmol/L — ABNORMAL LOW (ref 3.5–5.1)
Sodium: 137 mmol/L (ref 135–145)
TOTAL PROTEIN: 7.4 g/dL (ref 6.5–8.1)

## 2016-09-04 LAB — CBC
HCT: 43.8 % (ref 39.0–52.0)
Hemoglobin: 15.9 g/dL (ref 13.0–17.0)
MCH: 31.2 pg (ref 26.0–34.0)
MCHC: 36.3 g/dL — ABNORMAL HIGH (ref 30.0–36.0)
MCV: 85.9 fL (ref 78.0–100.0)
PLATELETS: 231 10*3/uL (ref 150–400)
RBC: 5.1 MIL/uL (ref 4.22–5.81)
RDW: 12 % (ref 11.5–15.5)
WBC: 10.3 10*3/uL (ref 4.0–10.5)

## 2016-09-04 LAB — LIPASE, BLOOD: LIPASE: 29 U/L (ref 11–51)

## 2016-09-04 LAB — URINALYSIS, ROUTINE W REFLEX MICROSCOPIC
Bilirubin Urine: NEGATIVE
Glucose, UA: NEGATIVE mg/dL
Hgb urine dipstick: NEGATIVE
Ketones, ur: 5 mg/dL — AB
Leukocytes, UA: NEGATIVE
NITRITE: NEGATIVE
PROTEIN: NEGATIVE mg/dL
Specific Gravity, Urine: 1.001 — ABNORMAL LOW (ref 1.005–1.030)
pH: 6 (ref 5.0–8.0)

## 2016-09-04 MED ORDER — IOPAMIDOL (ISOVUE-300) INJECTION 61%
100.0000 mL | Freq: Once | INTRAVENOUS | Status: AC | PRN
  Administered 2016-09-04: 100 mL via INTRAVENOUS

## 2016-09-04 MED ORDER — SODIUM CHLORIDE 0.9 % IV BOLUS (SEPSIS)
1000.0000 mL | Freq: Once | INTRAVENOUS | Status: AC
  Administered 2016-09-04: 1000 mL via INTRAVENOUS

## 2016-09-04 MED ORDER — IOPAMIDOL (ISOVUE-300) INJECTION 61%
INTRAVENOUS | Status: AC
  Filled 2016-09-04: qty 100

## 2016-09-04 NOTE — ED Provider Notes (Signed)
[facility]-EMERGENCY DEPT Provider Note   [name]: [id number] Arrival date & time: 09/04/16  0551     History   Chief Complaint Chief Complaint  Patient presents with  . Weakness    HPI Shane Wheeler is a 31 y.o. male presenting with unchanged left lower quadrant pain. He explains that he was seen in ED 5 days ago, given lactulose and had multiple episodes of loose stools and feels like he is dehydrated now. He reports being referred to the [facility] center and Gastroenterology but his appointment is on Monday and he cannot wait that long. He was hoping that we could expedite the process for him to see GI. He denies any changes since last seen in ED in terms of symptoms other than the diarrhea(loose stools) from miralax and feeling dehydrated.He reports quitting marijuana after his last visit, but this causes him not to eat and he feels weak because of it. He states that he smoke yesterday and it didn't make it any worse, so he is not sure that marijuana has anything to due with his symptoms. He denies nausea, vomiting, fever, chills, hematuria, dysuria, back pain.  HPI  Past Medical History:  Diagnosis Date  . ADHD (attention deficit hyperactivity disorder)   . Asthma     There are no active problems to display for this patient.   Past Surgical History:  Procedure Laterality Date  . APPENDECTOMY    . arm surgery Right   . TONSILLECTOMY         Home Medications    Prior to Admission medications   Medication Sig Start Date End Date Taking? Authorizing Provider  lactulose (CHRONULAC) 10 GM/15ML solution Take 20 g by mouth daily as needed for mild constipation.   Yes Historical Provider, MD  polyethylene glycol (MIRALAX / GLYCOLAX) packet Take 17 g by mouth daily as needed for mild constipation.   Yes Historical Provider, MD  bisacodyl (DULCOLAX) 5 MG EC tablet Take 15mg  daily for the next 3 days Patient not taking: Reported on 09/04/2016 08/31/16   Dahlia Client Muthersbaugh, PA-C     Family History History reviewed. No pertinent family history.  Social History Social History  Substance Use Topics  . Smoking status: Former Smoker    Types: Cigarettes  . Smokeless tobacco: Never Used  . Alcohol use Yes     Comment: daily     Allergies   Augmentin [amoxicillin-pot clavulanate]   Review of Systems Review of Systems  Constitutional: Negative for chills and fever.  HENT: Negative for ear pain and sore throat.   Eyes: Negative for pain and visual disturbance.  Respiratory: Negative for cough, choking, chest tightness, shortness of breath, wheezing and stridor.   Cardiovascular: Negative for chest pain and palpitations.  Gastrointestinal: Positive for abdominal pain and diarrhea. Negative for abdominal distention, blood in stool, nausea and vomiting.  Genitourinary: Negative for difficulty urinating, dysuria, flank pain and hematuria.  Musculoskeletal: Negative for arthralgias, back pain, neck pain and neck stiffness.  Skin: Negative for color change, pallor and rash.  Neurological: Negative for seizures and syncope.  All other systems reviewed and are negative.    Physical Exam Updated Vital Signs BP (!) 132/59 (BP Location: Left Arm)   Pulse (!) 51   Temp 97.6 F (36.4 C) (Oral)   Resp 16   SpO2 96%   Physical Exam  Constitutional: He appears well-developed and well-nourished. No distress.  Afebrile, nontoxic-appearing, sitting comfortably in bed in no acute distress.  HENT:  Head: Normocephalic  and atraumatic.  Eyes: Conjunctivae and EOM are normal.  Neck: Neck supple.  Cardiovascular: Normal rate, regular rhythm, normal heart sounds and intact distal pulses.   No murmur heard. Pulmonary/Chest: Effort normal and breath sounds normal. No respiratory distress. He has no wheezes. He has no rales. He exhibits no tenderness.  Abdominal: Soft. He exhibits no distension and no mass. There is tenderness. There is no rebound and no guarding.   Negative Murphy sign, negative McBurney's point tenderness, negative rebound. Left lower quadrant tenderness to deep palpation. He describes it as a pressure rather than pain/tender.  Musculoskeletal: He exhibits no edema.  Neurological: He is alert.  Skin: Skin is warm and dry. He is not diaphoretic. No erythema. No pallor.  Psychiatric: He has a normal mood and affect.  Nursing note and vitals reviewed.    ED Treatments / Results  Labs (all labs ordered are listed, but only abnormal results are displayed) Labs Reviewed  COMPREHENSIVE METABOLIC PANEL - Abnormal; Notable for the following:       Result Value   Potassium 3.4 (*)    Albumin 5.1 (*)    All other components within normal limits  CBC - Abnormal; Notable for the following:    MCHC 36.3 (*)    All other components within normal limits  URINALYSIS, ROUTINE W REFLEX MICROSCOPIC - Abnormal; Notable for the following:    Color, Urine COLORLESS (*)    Specific Gravity, Urine 1.001 (*)    Ketones, ur 5 (*)    All other components within normal limits  LIPASE, BLOOD    EKG  EKG Interpretation None       Radiology Ct Abdomen Pelvis W Contrast  Result Date: 09/04/2016 CLINICAL DATA:  Diarrhea and abdominal pain EXAM: CT ABDOMEN AND PELVIS WITH CONTRAST TECHNIQUE: Multidetector CT imaging of the abdomen and pelvis was performed using the standard protocol following bolus administration of intravenous contrast. CONTRAST:  100 mL ISOVUE-300 IOPAMIDOL (ISOVUE-300) INJECTION 61% COMPARISON:  May 18, 2016 FINDINGS: Lower chest: There is slight atelectasis in the right base posteriorly. Lung bases otherwise are clear peer Hepatobiliary: No focal liver lesions are appreciable. Gallbladder wall is not thickened. There is no biliary duct dilatation. Pancreas: There is no pancreatic mass or inflammatory focus. Spleen: No splenic lesions are evident. Adrenals/Urinary Tract: Adrenals appear normal bilaterally. Kidneys bilaterally  show no mass or hydronephrosis on either side. There is no renal or ureteral calculus on either side. Urinary bladder is midline with wall thickness within normal limits. Stomach/Bowel: There is no bowel wall or mesenteric thickening. No evident bowel obstruction. No free air or portal venous air. Vascular/Lymphatic: There is no abdominal aortic aneurysm. No vascular lesions are evident. There is no appreciable adenopathy in the abdomen or pelvis. Reproductive: Prostate and seminal vesicles are normal in size and contour. There is no pelvic mass. Other: Appendix is absent. Do appear appendiceal region inflammation. There is no ascites or abscess in the abdomen or pelvis. Musculoskeletal: There are no blastic or lytic bone lesions. No abdominal wall or intramuscular lesion. IMPRESSION: Appendix absent.  No periappendiceal region inflammation. No bowel wall thickening or bowel obstruction. No abscess. No renal or ureteral calculus. No hydronephrosis. A cause for patient's symptoms has not been established with this study. Electronically Signed   By: Bretta BangWilliam  Woodruff III M.D.   On: 09/04/2016 09:05    Procedures Procedures (including critical care time)  Medications Ordered in ED Medications  sodium chloride 0.9 % bolus 1,000 mL (0 mLs  Intravenous Stopped 09/04/16 0751)  iopamidol (ISOVUE-300) 61 % injection 100 mL (100 mLs Intravenous Contrast Given 09/04/16 0851)     Initial Impression / Assessment and Plan / ED Course  I have reviewed the triage vital signs and the nursing notes.  Pertinent labs & imaging results that were available during my care of the patient were reviewed by me and considered in my medical decision making (see chart for details).     Patient presents with chronic left lower quadrant discomfort. He was seen 5 days ago for the same. No change in pain, but patient has been having multiple loose stools after taking MiraLAX and felt like he was dehydrated.  Patient was given IV  fluids and on reassessment had significantly improved. CT without any abnormalities or acute process to explain his symptoms. Unremarkable labs and urine analysis. Patient is well-appearing nontoxic and afebrile. He was sleeping comfortably on my reassessment.  Discharge home with close follow-up with gastroenterology which is already scheduled for Monday. Patient was urged to quit smoking marijuana, drink plenty of fluids, stay well hydrated, exercise, and use a gentle diet. He states that he will be seeing GI on Monday and understands the importance of follow-up.  Discussed strict return precautions and advised to return to the emergency department if experiencing any new or worsening symptoms. Instructions were understood and patient agreed with discharge plan.  Final Clinical Impressions(s) / ED Diagnoses   Final diagnoses:  Left lower quadrant pain    New Prescriptions New Prescriptions   No medications on file     Georgiana Shore, Cordelia Poche 09/04/16 4098    Pricilla Loveless, MD 09/06/16 [phone]

## 2016-09-04 NOTE — Discharge Instructions (Signed)
As discussed, it is crucial that you follow up with Gastroenterology and your primary care provider for follow up, further evaluation and management of symptoms. Stay well hydrated and follow a blend diet. Try to increase your level of physical activity. Return to the emergency if fever/chills, nausea/vomiting, diarrhea, increased pain or other new concerning symptoms in the meantime.

## 2016-09-04 NOTE — ED Triage Notes (Signed)
Pt states that he feels really drained and weak, he was given lactulose and he's had severe diarrhea for three days Pt continues to complains of abd pain

## 2016-09-10 ENCOUNTER — Encounter: Payer: Self-pay | Admitting: Gastroenterology

## 2016-09-10 ENCOUNTER — Encounter: Payer: Self-pay | Admitting: Physician Assistant

## 2016-09-10 ENCOUNTER — Ambulatory Visit: Payer: Self-pay | Attending: Internal Medicine | Admitting: Physician Assistant

## 2016-09-10 VITALS — BP 115/75 | HR 66 | Temp 97.4°F | Wt 192.2 lb

## 2016-09-10 DIAGNOSIS — Z880 Allergy status to penicillin: Secondary | ICD-10-CM | POA: Insufficient documentation

## 2016-09-10 DIAGNOSIS — R1032 Left lower quadrant pain: Secondary | ICD-10-CM | POA: Insufficient documentation

## 2016-09-10 DIAGNOSIS — Z79899 Other long term (current) drug therapy: Secondary | ICD-10-CM | POA: Insufficient documentation

## 2016-09-10 DIAGNOSIS — Q430 Meckel's diverticulum (displaced) (hypertrophic): Secondary | ICD-10-CM | POA: Insufficient documentation

## 2016-09-10 DIAGNOSIS — Z8719 Personal history of other diseases of the digestive system: Secondary | ICD-10-CM

## 2016-09-10 NOTE — Progress Notes (Signed)
Shane Wheeler, is a 31 y.o. male  WUJ:[id number]  NFA:[id number]  DOB - Aug 01, 1985  Subjective:  Chief Complaint and HPI: Shane Wheeler is a 31 y.o. male here today to establish care and for a follow up visit after multiple ED visits for abdominal pain.  He started having the pain about 1 year ago.  Bloodwork and imaging have been unremarkable.  He wants to see a gastroenterologist.  The pain is in the LLQ.  He describes it as a dull ache that worsen in the evenings.  His bowels are not affected.  He has a h/o drug and alcohol use but quit all of those 1 year ago (except for occasional marijuana).  He also started exercising and eating healthy to see if he could make the pain go away, but it hasn't helped.  He eats mostly vegetables, protein, and fruit now with very little grains or gluten.  He denies N/V/D/C.  No unexplained weight loss.  He has 1-2 BMs per day that are well formed.  He had a Meckel's Diverticulum that was removed as a child and also and open appendectomy(separate surgeries)   ED/Hospital notes reviewed.   Social History:    ROS:   Constitutional:  No f/c, No night sweats, No unexplained weight loss. EENT:  No vision changes, No blurry vision, No hearing changes. No mouth, throat, or ear problems.  Respiratory: No cough, No SOB Cardiac: No CP, no palpitations GI:  + abd pain, No N/V/D. GU: No Urinary s/sx Musculoskeletal: No joint pain Neuro: No headache, no dizziness, no motor weakness.  Skin: No rash Endocrine:  No polydipsia. No polyuria.  Psych: Denies SI/HI  No problems updated.  ALLERGIES: Allergies  Allergen Reactions  . Augmentin [Amoxicillin-Pot Clavulanate] Other (See Comments)    Reaction:  Unknown  Has patient had a PCN reaction causing immediate rash, facial/tongue/throat swelling, SOB or lightheadedness with hypotension: Unsure Has patient had a PCN reaction causing severe rash involving mucus membranes or skin necrosis: Unsure Has patient had a  PCN reaction that required hospitalization Unsure Has patient had a PCN reaction occurring within the last 10 years: No If all of the above answers are "NO", then may proceed with Cephalosporin use.    PAST MEDICAL HISTORY: Past Medical History:  Diagnosis Date  . ADHD (attention deficit hyperactivity disorder)   . Asthma     MEDICATIONS AT HOME: Prior to Admission medications   Medication Sig Start Date End Date Taking? Authorizing Provider  polyethylene glycol (MIRALAX / GLYCOLAX) packet Take 17 g by mouth daily as needed for mild constipation.   Yes Historical Provider, MD     Objective:  EXAM:   Vitals:   09/10/16 1415  BP: 115/75  Pulse: 66  Temp: 97.4 F (36.3 C)  TempSrc: Oral  SpO2: 97%  Weight: 192 lb 3.2 oz (87.2 kg)    General appearance : A&OX3. NAD. Non-toxic-appearing HEENT: Atraumatic and Normocephalic.  PERRLA. EOM intact.  TM clear B. Mouth-MMM, post pharynx WNL w/o erythema, No PND. Neck: supple, no JVD. No cervical lymphadenopathy. No thyromegaly Chest/Lungs:  Breathing-non-labored, Good air entry bilaterally, breath sounds normal without rales, rhonchi, or wheezing  CVS: S1 S2 regular, no murmurs, gallops, rubs  Abdomen: Bowel sounds present, Non tender and not distended with no gaurding, rigidity or rebound. No megaly.  He does seem more TTP in the LLQ but very mild.   Extremities: Bilateral Lower Ext shows no edema, both legs are warm to touch with = pulse  throughout Neurology:  CN II-XII grossly intact, Non focal.   Psych:  TP linear. J/I WNL. Normal speech. Appropriate eye contact and affect.  Skin:  No Rash  Data Review No results found for: HGBA1C   Assessment & Plan   1. LLQ pain - Ambulatory referral to Gastroenterology - H. pylori Breath Collection  2. History of Meckel's diverticulum - Ambulatory referral to Gastroenterology  Patient have been counseled extensively about nutrition and exercise  Return in about 3 weeks (around  10/01/2016) for assign PCP/ f/up abdominal pain.  The patient was given clear instructions to go to ER or return to medical center if symptoms don't improve, worsen or new problems develop. The patient verbalized understanding. The patient was told to call to get lab results if they haven't heard anything in the next week.     Georgian Co, PA-C [facility] [place], [place] [phone]   09/10/2016, 2:42 PMPatient ID: Shane Wheeler, male   DOB: [birth year], 31 y.o.   MRN: [id number]

## 2016-09-14 LAB — H.PYLORI BREATH TEST (REFLEX): H. PYLORI BREATH TEST: NEGATIVE

## 2016-09-14 LAB — H. PYLORI BREATH TEST

## 2016-09-16 ENCOUNTER — Ambulatory Visit: Payer: Self-pay

## 2016-09-17 ENCOUNTER — Telehealth: Payer: Self-pay

## 2016-09-17 NOTE — Telephone Encounter (Signed)
Contacted pt to go over lab results pt is aware of results and doesn't have any questions or concerns 

## 2016-09-18 ENCOUNTER — Ambulatory Visit (INDEPENDENT_AMBULATORY_CARE_PROVIDER_SITE_OTHER): Payer: Self-pay | Admitting: Gastroenterology

## 2016-09-18 ENCOUNTER — Encounter: Payer: Self-pay | Admitting: Gastroenterology

## 2016-09-18 VITALS — BP 106/66 | HR 53 | Ht 71.0 in | Wt 195.0 lb

## 2016-09-18 DIAGNOSIS — R1032 Left lower quadrant pain: Secondary | ICD-10-CM

## 2016-09-18 MED ORDER — HYOSCYAMINE SULFATE 0.125 MG SL SUBL: 0.1250 mg | SUBLINGUAL_TABLET | Freq: Four times a day (QID) | SUBLINGUAL | 0 refills | Status: DC | PRN

## 2016-09-18 MED ORDER — NA SULFATE-K SULFATE-MG SULF 17.5-3.13-1.6 GM/177ML PO SOLN: ORAL | 0 refills | Status: DC

## 2016-09-18 NOTE — Progress Notes (Addendum)
09/18/2016 Sonia Baller [id number] Mar 14, 1986   HISTORY OF PRESENT ILLNESS:  This is a 31 year old male who is new to our office. He was referred here by Georgian Co, PA-C, for evaluation regarding left lower quadrant abdominal pain.  He tells me that this pain originally started years ago but it would only occur every once in a while and then go away. Recently it has been almost a constant pain that he describes as a pressure, sometimes severe. Says that it has been much worse over the past 3-6 months. He was seen in the ER at the end of March at which time he had a CT scan of the abdomen and pelvis with contrast performed. This showed essentially no abnormalities. Patient reports that he was told that he had a large amount of stool in his colon, however. He has now been taking MiraLAX as needed, but really does not report any issues with moving his bowels. CBC, CMP, lipase, urinalysis were all fairly unremarkable. He denies any rectal bleeding.  There has been no unexplained weight loss. He says that he has started exercising more and changed his diet to see if that would help with the pain. Has been eating mostly protein, fruits, vegetables. 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 He is concerned because he read that there could be other issues associated with that in adulthood.  Past Medical History:  Diagnosis Date  . ADHD (attention deficit hyperactivity disorder)   . Asthma    Past Surgical History:  Procedure Laterality Date  . APPENDECTOMY    . arm surgery Right   . TONSILLECTOMY      reports that he has quit smoking. His smoking use included Cigarettes. He has never used smokeless tobacco. He reports that he drinks alcohol. He reports that he uses drugs, including Marijuana. family history is not on file. Allergies  Allergen Reactions  . Augmentin [Amoxicillin-Pot Clavulanate] Other (See Comments)    Reaction:  Unknown  Has  patient had a PCN reaction causing immediate rash, facial/tongue/throat swelling, SOB or lightheadedness with hypotension: Unsure Has patient had a PCN reaction causing severe rash involving mucus membranes or skin necrosis: Unsure Has patient had a PCN reaction that required hospitalization Unsure Has patient had a PCN reaction occurring within the last 10 years: No If all of the above answers are "NO", then may proceed with Cephalosporin use.      Outpatient Encounter Prescriptions as of 09/18/2016  Medication Sig  . polyethylene glycol (MIRALAX / GLYCOLAX) packet Take 17 g by mouth daily as needed for mild constipation.   No facility-administered encounter medications on file as of 09/18/2016.      REVIEW OF SYSTEMS  : All other systems reviewed and negative except where noted in the History of Present Illness.   PHYSICAL EXAM: There were no vitals taken for this visit. General: Well developed white male in no acute distress Head: Normocephalic and atraumatic Eyes:  Sclerae anicteric, conjunctiva pink. Ears: Normal auditory acuity Lungs: Clear throughout to auscultation; no increased WOB. Heart: Regular rate and rhythm Abdomen: Soft, non-distended.  Normal bowel sounds.  Mild LLQ TTP. Rectal:  Will be done at the time of colonoscopy. Musculoskeletal: Symmetrical with no gross deformities  Skin: No lesions on visible extremities Extremities: No edema  Neurological: Alert oriented x 4, grossly non-focal Psychological:  Alert and cooperative. Normal mood and affect  ASSESSMENT AND PLAN: -LLQ abdominal pain:  At this  point I am unsure of the cause of his pain. He has history of Meckel's diverticulum and had surgery for that when he was a baby. CT scan of the abdomen and pelvis was unremarkable although he was told that there was a large amount of stool in his colon, however, there is no comment of that and the actual report. We'll schedule him for colonoscopy to rule out GI/colonic  source of pain.  Will try Levsin 0.125 mg every 6 hours as needed.  *The risks, benefits, and alternatives to colonoscopy were discussed with the patient and he consents to proceed.  CC:  Anders Simmonds, PA-C  Agree with Ms. Rise Mu management.  Iva Boop, MD, [facility]

## 2016-09-18 NOTE — Patient Instructions (Addendum)
You have been scheduled for a colonoscopy. Please follow written instructions given to you at your visit today.  Please pick up your prep supplies at the pharmacy within the next 1-3 days. If you use inhalers (even only as needed), please bring them with you on the day of your procedure.  We have sent the following medications to your pharmacy for you to pick up at your convenience: Levsin 01.25mg  SL every 6 hours a needed.  Normal BMI (Body Mass Index- based on height and weight) is between 19 and 25. Your BMI today is There is no height or weight on file to calculate BMI. Marland Kitchen Please consider follow up  regarding your BMI with your Primary Care Provider.  Thank you

## 2016-09-25 ENCOUNTER — Ambulatory Visit (AMBULATORY_SURGERY_CENTER): Payer: Self-pay | Admitting: Internal Medicine

## 2016-09-25 ENCOUNTER — Encounter: Payer: Self-pay | Admitting: Gastroenterology

## 2016-09-25 ENCOUNTER — Encounter: Payer: Self-pay | Admitting: Internal Medicine

## 2016-09-25 VITALS — BP 102/66 | HR 62 | Temp 98.4°F | Resp 15 | Ht 71.0 in | Wt 195.0 lb

## 2016-09-25 DIAGNOSIS — R1032 Left lower quadrant pain: Secondary | ICD-10-CM | POA: Insufficient documentation

## 2016-09-25 DIAGNOSIS — R194 Change in bowel habit: Secondary | ICD-10-CM

## 2016-09-25 MED ORDER — SODIUM CHLORIDE 0.9 % IV SOLN: 500.0000 mL | INTRAVENOUS | Status: DC

## 2016-09-25 NOTE — Progress Notes (Signed)
Report given to PACU, vss 

## 2016-09-25 NOTE — Patient Instructions (Addendum)
This exam is normal. No cause of pain identified.  I suspect the pain is likely musculoskeletal or neuropathic in origin. While I can understand it could be worrisome do not think anything bad going on.  Please return to wellness clinic where you were seen - I have made some recommendations for treatment although if this does not bother you too much and you can "live with it" that is fine.  I appreciate the opportunity to care for you. Iva Boop, MD, [facility]   Discharge instructions given. Normal exam. Resume previous medications. YOU HAD AN ENDOSCOPIC PROCEDURE TODAY AT THE [facility]:   Refer to the procedure report that was given to you for any specific questions about what was found during the examination.  If the procedure report does not answer your questions, please call your gastroenterologist to clarify.  If you requested that your care partner not be given the details of your procedure findings, then the procedure report has been included in a sealed envelope for you to review at your convenience later.  YOU SHOULD EXPECT: Some feelings of bloating in the abdomen. Passage of more gas than usual.  Walking can help get rid of the air that was put into your GI tract during the procedure and reduce the bloating. If you had a lower endoscopy (such as a colonoscopy or flexible sigmoidoscopy) you may notice spotting of blood in your stool or on the toilet paper. If you underwent a bowel prep for your procedure, you may not have a normal bowel movement for a few days.  Please Note:  You might notice some irritation and congestion in your nose or some drainage.  This is from the oxygen used during your procedure.  There is no need for concern and it should clear up in a day or so.  SYMPTOMS TO REPORT IMMEDIATELY:   Following lower endoscopy (colonoscopy or flexible sigmoidoscopy):  Excessive amounts of blood in the stool  Significant tenderness or worsening of  abdominal pains  Swelling of the abdomen that is new, acute  Fever of 100F or higher   For urgent or emergent issues, a gastroenterologist can be reached at any hour by calling [phone].   DIET:  We do recommend a small meal at first, but then you may proceed to your regular diet.  Drink plenty of fluids but you should avoid alcoholic beverages for 24 hours.  ACTIVITY:  You should plan to take it easy for the rest of today and you should NOT DRIVE or use heavy machinery until tomorrow (because of the sedation medicines used during the test).    FOLLOW UP: Our staff will call the number listed on your records the next business day following your procedure to check on you and address any questions or concerns that you may have regarding the information given to you following your procedure. If we do not reach you, we will leave a message.  However, if you are feeling well and you are not experiencing any problems, there is no need to return our call.  We will assume that you have returned to your regular daily activities without incident.  If any biopsies were taken you will be contacted by phone or by letter within the next 1-3 weeks.  Please call us at [phone] if you have not heard about the biopsies in 3 weeks.    SIGNATURES/CONFIDENTIALITY: You and/or your care partner have signed paperwork which will be entered into your electronic  medical record.  These signatures attest to the fact that that the information above on your After Visit Summary has been reviewed and is understood.  Full responsibility of the confidentiality of this discharge information lies with you and/or your care-partner.

## 2016-09-25 NOTE — Op Note (Addendum)
[facility] Patient Name: Shane Wheeler Procedure Date: 09/25/2016 3:15 PM MRN: [id number] Endoscopist: Iva Boop , MD Age: 31 Referring MD:  Date of Birth: [birth year] Gender: Male Account #: [id number] Procedure:                Colonoscopy Indications:              Abdominal pain in the left lower quadrant, Change                            in bowel habits Medicines:                Propofol per Anesthesia, Monitored Anesthesia Care Procedure:                Pre-Anesthesia Assessment:                           - Prior to the procedure, a History and Physical                            was performed, and patient medications and                            allergies were reviewed. The patient's tolerance of                            previous anesthesia was also reviewed. The risks                            and benefits of the procedure and the sedation                            options and risks were discussed with the patient.                            All questions were answered, and informed consent                            was obtained. Prior Anticoagulants: The patient has                            taken no previous anticoagulant or antiplatelet                            agents. ASA Grade Assessment: I - A normal, healthy                            patient. After reviewing the risks and benefits,                            the patient was deemed in satisfactory condition to                            undergo the procedure.  After obtaining informed consent, the colonoscope                            was passed under direct vision. Throughout the                            procedure, the patient's blood pressure, pulse, and                            oxygen saturations were monitored continuously. The                            Colonoscope was introduced through the anus and                            advanced to the the cecum,  identified by                            appendiceal orifice and ileocecal valve. The                            colonoscopy was performed without difficulty. The                            patient tolerated the procedure well. The quality                            of the bowel preparation was excellent. The                            ileocecal valve, appendiceal orifice, and rectum                            were photographed. The bowel preparation used was                            Miralax. Scope In: 3:21:38 PM Scope Out: 3:28:56 PM Scope Withdrawal Time: 0 hours 5 minutes 5 seconds  Total Procedure Duration: 0 hours 7 minutes 18 seconds  Findings:                 The perianal and digital rectal examinations were                            normal.                           The entire examined colon appeared normal on direct                            and retroflexion views. Complications:            No immediate complications. Estimated Blood Loss:     Estimated blood loss: none. Impression:               - The entire examined colon is normal on direct and  retroflexion views.                           - No specimens collected. Recommendation:           - Patient has a contact number available for                            emergencies. The signs and symptoms of potential                            delayed complications were discussed with the                            patient. Return to normal activities tomorrow.                            Written discharge instructions were provided to the                            patient.                           - Resume previous diet.                           - Continue present medications.                           - No recommendation at this time regarding repeat                            colonoscopy due to young age.                           - Do not think pain is GI in origin - may be                             musculoskeletal vs neuropathic. Could trt tricyclic                            agent or other neuromodulators if desired.                           Return to wellness Clinic.                           See GI prn but do not think this pain is GI in                            origin.                           - I have also advised him to modify vigorous  exercise routine and not strain abdominal wall and                            core muscles as this may be part of or all of the                            problem and source of pain. Iva Boop, MD 09/25/2016 3:40:10 PM This report has been signed electronically.

## 2016-09-26 ENCOUNTER — Telehealth: Payer: Self-pay | Admitting: *Deleted

## 2016-09-26 NOTE — Telephone Encounter (Signed)
  Follow up Call-  Call back number 09/25/2016  Post procedure Call Back phone  # [phone]  Permission to leave phone message Yes  Some recent data might be hidden    [facility]

## 2016-09-26 NOTE — Telephone Encounter (Signed)
  Follow up Call-  Call back number 09/25/2016  Post procedure Call Back phone  # [phone]  Permission to leave phone message Yes  Some recent data might be hidden    [facility] 

## 2016-10-03 ENCOUNTER — Ambulatory Visit: Payer: Self-pay | Admitting: Family Medicine

## 2016-10-15 ENCOUNTER — Encounter (HOSPITAL_COMMUNITY): Payer: Self-pay | Admitting: Emergency Medicine

## 2016-10-15 ENCOUNTER — Emergency Department (HOSPITAL_COMMUNITY)
Admission: EM | Admit: 2016-10-15 | Discharge: 2016-10-15 | Disposition: A | Discharge status: Home / Self Care | Payer: Self-pay | Attending: Emergency Medicine | Admitting: Emergency Medicine

## 2016-10-15 ENCOUNTER — Emergency Department (HOSPITAL_COMMUNITY): Payer: Self-pay

## 2016-10-15 DIAGNOSIS — T148XXA Other injury of unspecified body region, initial encounter: Secondary | ICD-10-CM

## 2016-10-15 DIAGNOSIS — J45909 Unspecified asthma, uncomplicated: Secondary | ICD-10-CM | POA: Insufficient documentation

## 2016-10-15 DIAGNOSIS — Y92410 Unspecified street and highway as the place of occurrence of the external cause: Secondary | ICD-10-CM | POA: Insufficient documentation

## 2016-10-15 DIAGNOSIS — Z87891 Personal history of nicotine dependence: Secondary | ICD-10-CM | POA: Insufficient documentation

## 2016-10-15 DIAGNOSIS — F909 Attention-deficit hyperactivity disorder, unspecified type: Secondary | ICD-10-CM | POA: Insufficient documentation

## 2016-10-15 DIAGNOSIS — S51002A Unspecified open wound of left elbow, initial encounter: Secondary | ICD-10-CM | POA: Insufficient documentation

## 2016-10-15 DIAGNOSIS — Y939 Activity, unspecified: Secondary | ICD-10-CM | POA: Insufficient documentation

## 2016-10-15 DIAGNOSIS — Y999 Unspecified external cause status: Secondary | ICD-10-CM | POA: Insufficient documentation

## 2016-10-15 DIAGNOSIS — T07XXXA Unspecified multiple injuries, initial encounter: Secondary | ICD-10-CM

## 2016-10-15 MED ORDER — TETANUS-DIPHTH-ACELL PERTUSSIS 5-2.5-18.5 LF-MCG/0.5 IM SUSP
0.5000 mL | Freq: Once | INTRAMUSCULAR | Status: AC
  Administered 2016-10-15: 0.5 mL via INTRAMUSCULAR
  Filled 2016-10-15: qty 0.5

## 2016-10-15 MED ORDER — LIDOCAINE HCL (PF) 1 % IJ SOLN
10.0000 mL | Freq: Once | INTRAMUSCULAR | Status: AC
  Administered 2016-10-15: 10 mL
  Filled 2016-10-15: qty 30

## 2016-10-15 MED ORDER — CEPHALEXIN 500 MG PO CAPS: 500.0000 mg | ORAL_CAPSULE | Freq: Three times a day (TID) | ORAL | 0 refills | Status: DC

## 2016-10-15 MED ORDER — METHOCARBAMOL 500 MG PO TABS: 500.0000 mg | ORAL_TABLET | Freq: Four times a day (QID) | ORAL | 0 refills | Status: DC | PRN

## 2016-10-15 MED ORDER — NAPROXEN 500 MG PO TABS: 500.0000 mg | ORAL_TABLET | Freq: Two times a day (BID) | ORAL | 0 refills | Status: DC

## 2016-10-15 NOTE — Discharge Instructions (Signed)
Read the information below.  Use the prescribed medication as directed.  Please discuss all new medications with your pharmacist.  You may return to the Emergency Department at any time for worsening condition or any new symptoms that concern you.    If you develop redness, swelling, pus draining from the wound, or fevers greater than 100.4, return to the ER immediately for a recheck.    You have a retained glass foreign body in your left elbow.  Please see the specialist listed above for further evaluation and procedure for removal.
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
## 2016-11-05 ENCOUNTER — Ambulatory Visit (HOSPITAL_COMMUNITY): Payer: Self-pay | Admitting: Certified Registered"

## 2016-11-05 ENCOUNTER — Encounter (HOSPITAL_COMMUNITY): Payer: Self-pay | Admitting: *Deleted

## 2016-11-05 ENCOUNTER — Ambulatory Visit (HOSPITAL_COMMUNITY)
Admission: RE | Admit: 2016-11-05 | Discharge: 2016-11-05 | Disposition: A | Discharge status: Home / Self Care | Payer: Self-pay | Source: Ambulatory Visit | Attending: Orthopedic Surgery | Admitting: Orthopedic Surgery

## 2016-11-05 ENCOUNTER — Encounter (HOSPITAL_COMMUNITY)
Admission: RE | Disposition: A | Discharge status: Home / Self Care | Payer: Self-pay | Source: Ambulatory Visit | Attending: Orthopedic Surgery

## 2016-11-05 DIAGNOSIS — Z8719 Personal history of other diseases of the digestive system: Secondary | ICD-10-CM | POA: Insufficient documentation

## 2016-11-05 DIAGNOSIS — J45909 Unspecified asthma, uncomplicated: Secondary | ICD-10-CM | POA: Insufficient documentation

## 2016-11-05 DIAGNOSIS — F909 Attention-deficit hyperactivity disorder, unspecified type: Secondary | ICD-10-CM | POA: Insufficient documentation

## 2016-11-05 DIAGNOSIS — F1290 Cannabis use, unspecified, uncomplicated: Secondary | ICD-10-CM | POA: Insufficient documentation

## 2016-11-05 DIAGNOSIS — M7022 Olecranon bursitis, left elbow: Secondary | ICD-10-CM | POA: Insufficient documentation

## 2016-11-05 DIAGNOSIS — Z87891 Personal history of nicotine dependence: Secondary | ICD-10-CM | POA: Insufficient documentation

## 2016-11-05 DIAGNOSIS — Z880 Allergy status to penicillin: Secondary | ICD-10-CM | POA: Insufficient documentation

## 2016-11-05 DIAGNOSIS — S51022A Laceration with foreign body of left elbow, initial encounter: Secondary | ICD-10-CM

## 2016-11-05 DIAGNOSIS — M795 Residual foreign body in soft tissue: Secondary | ICD-10-CM | POA: Insufficient documentation

## 2016-11-05 HISTORY — PX: FOREIGN BODY REMOVAL: SHX962

## 2016-11-05 LAB — COMPREHENSIVE METABOLIC PANEL
ALBUMIN: 4.6 g/dL (ref 3.5–5.0)
ALK PHOS: 79 U/L (ref 38–126)
ALT: 25 U/L (ref 17–63)
AST: 25 U/L (ref 15–41)
Anion gap: 9 (ref 5–15)
BUN: 15 mg/dL (ref 6–20)
CALCIUM: 9.4 mg/dL (ref 8.9–10.3)
CO2: 23 mmol/L (ref 22–32)
CREATININE: 0.85 mg/dL (ref 0.61–1.24)
Chloride: 106 mmol/L (ref 101–111)
GFR calc Af Amer: >60 mL/min (ref >60)
GFR calc non Af Amer: >60 mL/min (ref >60)
GLUCOSE: 91 mg/dL (ref 65–99)
Potassium: 4.1 mmol/L (ref 3.5–5.1)
SODIUM: 138 mmol/L (ref 135–145)
Total Bilirubin: 0.4 mg/dL (ref 0.3–1.2)
Total Protein: 7.1 g/dL (ref 6.5–8.1)

## 2016-11-05 LAB — CBC
HCT: 46.3 % (ref 39.0–52.0)
HEMOGLOBIN: 16.4 g/dL (ref 13.0–17.0)
MCH: 31.4 pg (ref 26.0–34.0)
MCHC: 35.4 g/dL (ref 30.0–36.0)
MCV: 88.7 fL (ref 78.0–100.0)
Platelets: 207 10*3/uL (ref 150–400)
RBC: 5.22 MIL/uL (ref 4.22–5.81)
RDW: 12.2 % (ref 11.5–15.5)
WBC: 7.4 10*3/uL (ref 4.0–10.5)

## 2016-11-05 SURGERY — REMOVAL FOREIGN BODY EXTREMITY
Anesthesia: General | Site: Arm Upper | Laterality: Left

## 2016-11-05 MED ORDER — FENTANYL CITRATE (PF) 100 MCG/2ML IJ SOLN
INTRAMUSCULAR | Status: DC | PRN
  Administered 2016-11-05: 50 ug via INTRAVENOUS
  Administered 2016-11-05: 100 ug via INTRAVENOUS

## 2016-11-05 MED ORDER — MEPERIDINE HCL 25 MG/ML IJ SOLN: 6.2500 mg | INTRAMUSCULAR | Status: DC | PRN

## 2016-11-05 MED ORDER — PROPOFOL 10 MG/ML IV BOLUS
INTRAVENOUS | Status: AC
  Filled 2016-11-05: qty 20

## 2016-11-05 MED ORDER — ONDANSETRON HCL 4 MG/2ML IJ SOLN: 4.0000 mg | Freq: Once | INTRAMUSCULAR | Status: DC | PRN

## 2016-11-05 MED ORDER — HYDROCODONE-ACETAMINOPHEN 5-300 MG PO TABS: 1.0000 | ORAL_TABLET | Freq: Two times a day (BID) | ORAL | 0 refills | Status: DC | PRN

## 2016-11-05 MED ORDER — FENTANYL CITRATE (PF) 250 MCG/5ML IJ SOLN
INTRAMUSCULAR | Status: AC
  Filled 2016-11-05: qty 5

## 2016-11-05 MED ORDER — 0.9 % SODIUM CHLORIDE (POUR BTL) OPTIME
TOPICAL | Status: DC | PRN
  Administered 2016-11-05: 1000 mL

## 2016-11-05 MED ORDER — HYDROMORPHONE HCL 1 MG/ML IJ SOLN
0.2500 mg | INTRAMUSCULAR | Status: DC | PRN
  Administered 2016-11-05 (×2): 0.5 mg via INTRAVENOUS

## 2016-11-05 MED ORDER — MIDAZOLAM HCL 2 MG/2ML IJ SOLN
INTRAMUSCULAR | Status: AC
  Filled 2016-11-05: qty 2

## 2016-11-05 MED ORDER — LACTATED RINGERS IV SOLN
INTRAVENOUS | Status: DC
  Administered 2016-11-05 (×2): via INTRAVENOUS

## 2016-11-05 MED ORDER — ONDANSETRON HCL 4 MG/2ML IJ SOLN
INTRAMUSCULAR | Status: AC
  Filled 2016-11-05: qty 2

## 2016-11-05 MED ORDER — CLINDAMYCIN PHOSPHATE 900 MG/50ML IV SOLN
INTRAVENOUS | Status: AC
  Filled 2016-11-05: qty 50

## 2016-11-05 MED ORDER — PROPOFOL 10 MG/ML IV BOLUS
INTRAVENOUS | Status: DC | PRN
  Administered 2016-11-05: 200 mg via INTRAVENOUS
  Administered 2016-11-05: 40 mg via INTRAVENOUS

## 2016-11-05 MED ORDER — BUPIVACAINE HCL (PF) 0.25 % IJ SOLN
INTRAMUSCULAR | Status: AC
  Filled 2016-11-05: qty 30

## 2016-11-05 MED ORDER — BUPIVACAINE HCL (PF) 0.25 % IJ SOLN
INTRAMUSCULAR | Status: DC | PRN
  Administered 2016-11-05: 10 mL

## 2016-11-05 MED ORDER — HYDROMORPHONE HCL 1 MG/ML IJ SOLN
INTRAMUSCULAR | Status: AC
  Filled 2016-11-05: qty 0.5

## 2016-11-05 MED ORDER — LIDOCAINE HCL (CARDIAC) 20 MG/ML IV SOLN
INTRAVENOUS | Status: DC | PRN
  Administered 2016-11-05: 100 mg via INTRAVENOUS

## 2016-11-05 MED ORDER — CLINDAMYCIN PHOSPHATE 900 MG/50ML IV SOLN
900.0000 mg | INTRAVENOUS | Status: AC
  Administered 2016-11-05: 900 mg via INTRAVENOUS

## 2016-11-05 MED ORDER — CHLORHEXIDINE GLUCONATE 4 % EX LIQD: 60.0000 mL | Freq: Once | CUTANEOUS | Status: DC

## 2016-11-05 MED ORDER — ONDANSETRON HCL 4 MG/2ML IJ SOLN
INTRAMUSCULAR | Status: DC | PRN
  Administered 2016-11-05: 4 mg via INTRAVENOUS

## 2016-11-05 MED ORDER — MIDAZOLAM HCL 5 MG/5ML IJ SOLN
INTRAMUSCULAR | Status: DC | PRN
  Administered 2016-11-05: 2 mg via INTRAVENOUS

## 2016-11-05 SURGICAL SUPPLY — 56 items
BANDAGE ACE 3X5.8 VEL STRL LF (GAUZE/BANDAGES/DRESSINGS) ×3 IMPLANT
BANDAGE ACE 4X5 VEL STRL LF (GAUZE/BANDAGES/DRESSINGS) ×3 IMPLANT
BANDAGE ELASTIC 4 VELCRO ST LF (GAUZE/BANDAGES/DRESSINGS) ×3 IMPLANT
BNDG COHESIVE 1X5 TAN STRL LF (GAUZE/BANDAGES/DRESSINGS) IMPLANT
BNDG CONFORM 2 STRL LF (GAUZE/BANDAGES/DRESSINGS) IMPLANT
BNDG ESMARK 4X9 LF (GAUZE/BANDAGES/DRESSINGS) ×3 IMPLANT
BNDG GAUZE ELAST 4 BULKY (GAUZE/BANDAGES/DRESSINGS) ×3 IMPLANT
CORDS BIPOLAR (ELECTRODE) ×3 IMPLANT
COVER SURGICAL LIGHT HANDLE (MISCELLANEOUS) ×3 IMPLANT
CUFF TOURNIQUET SINGLE 18IN (TOURNIQUET CUFF) ×3 IMPLANT
CUFF TOURNIQUET SINGLE 24IN (TOURNIQUET CUFF) IMPLANT
DRAIN PENROSE 1/4X12 LTX STRL (WOUND CARE) IMPLANT
DRAPE SURG 17X23 STRL (DRAPES) ×3 IMPLANT
DRSG ADAPTIC 3X8 NADH LF (GAUZE/BANDAGES/DRESSINGS) ×3 IMPLANT
ELECT REM PT RETURN 9FT ADLT (ELECTROSURGICAL)
ELECTRODE REM PT RTRN 9FT ADLT (ELECTROSURGICAL) IMPLANT
GAUZE SPONGE 4X4 12PLY STRL (GAUZE/BANDAGES/DRESSINGS) ×3 IMPLANT
GAUZE XEROFORM 1X8 LF (GAUZE/BANDAGES/DRESSINGS) ×3 IMPLANT
GAUZE XEROFORM 5X9 LF (GAUZE/BANDAGES/DRESSINGS) ×3 IMPLANT
GLOVE BIOGEL PI IND STRL 8.5 (GLOVE) ×1 IMPLANT
GLOVE BIOGEL PI INDICATOR 8.5 (GLOVE) ×2
GLOVE SURG ORTHO 8.0 STRL STRW (GLOVE) ×3 IMPLANT
GOWN STRL REUS W/ TWL LRG LVL3 (GOWN DISPOSABLE) ×3 IMPLANT
GOWN STRL REUS W/ TWL XL LVL3 (GOWN DISPOSABLE) ×1 IMPLANT
GOWN STRL REUS W/TWL LRG LVL3 (GOWN DISPOSABLE) ×6
GOWN STRL REUS W/TWL XL LVL3 (GOWN DISPOSABLE) ×2
HANDPIECE INTERPULSE COAX TIP (DISPOSABLE)
KIT BASIN OR (CUSTOM PROCEDURE TRAY) ×3 IMPLANT
KIT ROOM TURNOVER OR (KITS) ×3 IMPLANT
MANIFOLD NEPTUNE II (INSTRUMENTS) ×3 IMPLANT
NEEDLE HYPO 25GX1X1/2 BEV (NEEDLE) IMPLANT
NS IRRIG 1000ML POUR BTL (IV SOLUTION) ×3 IMPLANT
PACK ORTHO EXTREMITY (CUSTOM PROCEDURE TRAY) ×3 IMPLANT
PAD ARMBOARD 7.5X6 YLW CONV (MISCELLANEOUS) ×6 IMPLANT
PAD CAST 4YDX4 CTTN HI CHSV (CAST SUPPLIES) ×1 IMPLANT
PADDING CAST COTTON 4X4 STRL (CAST SUPPLIES) ×2
SET HNDPC FAN SPRY TIP SCT (DISPOSABLE) IMPLANT
SLING ARM FOAM STRAP LRG (SOFTGOODS) ×3 IMPLANT
SOAP 2 % CHG 4 OZ (WOUND CARE) ×3 IMPLANT
SPONGE LAP 18X18 X RAY DECT (DISPOSABLE) ×3 IMPLANT
SPONGE LAP 4X18 X RAY DECT (DISPOSABLE) ×3 IMPLANT
SUCTION FRAZIER HANDLE 10FR (MISCELLANEOUS) ×2
SUCTION TUBE FRAZIER 10FR DISP (MISCELLANEOUS) ×1 IMPLANT
SUT ETHILON 4 0 PS 2 18 (SUTURE) IMPLANT
SUT ETHILON 5 0 P 3 18 (SUTURE)
SUT NYLON ETHILON 5-0 P-3 1X18 (SUTURE) IMPLANT
SUT PROLENE 3 0 PS 1 (SUTURE) ×6 IMPLANT
SWAB CULTURE ESWAB REG 1ML (MISCELLANEOUS) IMPLANT
SYR CONTROL 10ML LL (SYRINGE) ×3 IMPLANT
TOWEL OR 17X24 6PK STRL BLUE (TOWEL DISPOSABLE) ×3 IMPLANT
TOWEL OR 17X26 10 PK STRL BLUE (TOWEL DISPOSABLE) ×3 IMPLANT
TUBE CONNECTING 12'X1/4 (SUCTIONS) ×1
TUBE CONNECTING 12X1/4 (SUCTIONS) ×2 IMPLANT
UNDERPAD 30X30 (UNDERPADS AND DIAPERS) ×3 IMPLANT
WATER STERILE IRR 1000ML POUR (IV SOLUTION) ×3 IMPLANT
YANKAUER SUCT BULB TIP NO VENT (SUCTIONS) ×3 IMPLANT

## 2016-11-05 NOTE — Anesthesia Procedure Notes (Addendum)
Procedure Name: LMA Insertion Date/Time: 11/05/2016 8:08 PM Performed by: Kelly SplinterHAWKINS, Jesusa Stenerson B Pre-anesthesia Checklist: Patient identified, Emergency Drugs available, Suction available and Patient being monitored Patient Re-evaluated:Patient Re-evaluated prior to inductionOxygen Delivery Method: Circle System Utilized Preoxygenation: Pre-oxygenation with 100% oxygen Intubation Type: IV induction Ventilation: Mask ventilation without difficulty LMA: LMA inserted LMA Size: 5.0 Number of attempts: 1 Placement Confirmation: positive ETCO2 Tube secured with: Tape Dental Injury: Teeth and Oropharynx as per pre-operative assessment

## 2016-11-05 NOTE — Transfer of Care (Signed)
Immediate Anesthesia Transfer of Care Note  Patient: Shane Wheeler  Procedure(s) Performed: Procedure(s): Left elbow wound exploration and foreign body removal (Left)  Patient Location: PACU  Anesthesia Type:General  Level of Consciousness: awake, alert  and oriented  Airway & Oxygen Therapy: Patient Spontanous Breathing  Post-op Assessment: Report given to RN, Post -op Vital signs reviewed and stable and Patient moving all extremities X 4  Post vital signs: Reviewed and stable  Last Vitals:  Vitals:   11/05/16 1540  BP: (!) 116/56  Pulse: 66  Resp: 18  Temp: 36.9 C    Last Pain:  Vitals:   11/05/16 1540  TempSrc: Oral      Patients Stated Pain Goal: 3 (11/05/16 1534)  Complications: No apparent anesthesia complications

## 2016-11-05 NOTE — Discharge Instructions (Signed)
KEEP BANDAGE CLEAN AND DRY CALL OFFICE FOR F/U APPT [phone] in 12 days KEEP HAND ELEVATED ABOVE HEART OK TO APPLY ICE TO OPERATIVE AREA CONTACT OFFICE IF ANY WORSENING PAIN OR CONCERNS. 
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
## 2016-11-05 NOTE — Anesthesia Postprocedure Evaluation (Signed)
Anesthesia Post Note  Patient: Torres Clifton CustardAaron Zollars  Procedure(s) Performed: Procedure(s) (LRB): Left elbow wound exploration and foreign body removal (Left)  Patient location during evaluation: PACU Anesthesia Type: General Level of consciousness: awake and alert Pain management: pain level controlled Vital Signs Assessment: post-procedure vital signs reviewed and stable Respiratory status: spontaneous breathing, nonlabored ventilation, respiratory function stable and patient connected to nasal cannula oxygen Cardiovascular status: blood pressure returned to baseline and stable Postop Assessment: no signs of nausea or vomiting Anesthetic complications: no       Last Vitals:  Vitals:   11/05/16 2100 11/05/16 2115  BP: 116/66 105/73  Pulse: 65 64  Resp: 17 14  Temp:      Last Pain:  Vitals:   11/05/16 2115  TempSrc:   PainSc: 4                  Reilley Valentine DAVID

## 2016-11-05 NOTE — Anesthesia Preprocedure Evaluation (Addendum)
Anesthesia Evaluation  Patient identified by MRN, date of birth, ID band Patient awake    Reviewed: Allergy & Precautions, NPO status , Patient's Chart, lab work & pertinent test results  Airway Mallampati: II  TM Distance: >3 FB Neck ROM: Full    Dental  (+) Teeth Intact, Dental Advisory Given   Pulmonary asthma ,    Pulmonary exam normal breath sounds clear to auscultation       Cardiovascular Exercise Tolerance: Good negative cardio ROS Normal cardiovascular exam Rhythm:Regular Rate:Normal     Neuro/Psych negative neurological ROS     GI/Hepatic negative GI ROS, (+)     substance abuse  marijuana use,   Endo/Other  negative endocrine ROS  Renal/GU negative Renal ROS     Musculoskeletal negative musculoskeletal ROS (+)   Abdominal   Peds  (+) ADHD Hematology negative hematology ROS (+)   Anesthesia Other Findings Day of surgery medications reviewed with the patient.  Reproductive/Obstetrics                            Anesthesia Physical Anesthesia Plan  ASA: II  Anesthesia Plan: General   Post-op Pain Management:    Induction: Intravenous  Airway Management Planned: LMA  Additional Equipment:   Intra-op Plan:   Post-operative Plan: Extubation in OR  Informed Consent: I have reviewed the patients History and Physical, chart, labs and discussed the procedure including the risks, benefits and alternatives for the proposed anesthesia with the patient or authorized representative who has indicated his/her understanding and acceptance.   Dental advisory given  Plan Discussed with: CRNA  Anesthesia Plan Comments: (Risks/benefits of general anesthesia discussed with patient including risk of damage to teeth, lips, gum, and tongue, nausea/vomiting, allergic reactions to medications, and the possibility of heart attack, stroke and death.  All patient questions answered.  Patient  wishes to proceed.)       Anesthesia Quick Evaluation

## 2016-11-06 ENCOUNTER — Encounter (HOSPITAL_COMMUNITY): Payer: Self-pay | Admitting: Orthopedic Surgery

## 2016-11-21 ENCOUNTER — Emergency Department (HOSPITAL_COMMUNITY): Payer: Self-pay

## 2016-11-21 ENCOUNTER — Encounter (HOSPITAL_COMMUNITY): Payer: Self-pay | Admitting: Family Medicine

## 2016-11-21 ENCOUNTER — Emergency Department (HOSPITAL_COMMUNITY)
Admission: EM | Admit: 2016-11-21 | Discharge: 2016-11-21 | Disposition: A | Discharge status: Home / Self Care | Payer: Self-pay | Attending: Emergency Medicine | Admitting: Emergency Medicine

## 2016-11-21 DIAGNOSIS — Z79899 Other long term (current) drug therapy: Secondary | ICD-10-CM | POA: Insufficient documentation

## 2016-11-21 DIAGNOSIS — M7989 Other specified soft tissue disorders: Secondary | ICD-10-CM | POA: Insufficient documentation

## 2016-11-21 DIAGNOSIS — F909 Attention-deficit hyperactivity disorder, unspecified type: Secondary | ICD-10-CM | POA: Insufficient documentation

## 2016-11-21 DIAGNOSIS — M79632 Pain in left forearm: Secondary | ICD-10-CM

## 2016-11-21 DIAGNOSIS — Z87891 Personal history of nicotine dependence: Secondary | ICD-10-CM | POA: Insufficient documentation

## 2016-11-21 DIAGNOSIS — M799 Soft tissue disorder, unspecified: Secondary | ICD-10-CM

## 2016-11-21 DIAGNOSIS — J45909 Unspecified asthma, uncomplicated: Secondary | ICD-10-CM | POA: Insufficient documentation

## 2016-11-21 LAB — I-STAT CHEM 8, ED
BUN: 17 mg/dL (ref 6–20)
CALCIUM ION: 1.21 mmol/L (ref 1.15–1.40)
CHLORIDE: 102 mmol/L (ref 101–111)
Creatinine, Ser: 0.9 mg/dL (ref 0.61–1.24)
Glucose, Bld: 92 mg/dL (ref 65–99)
HCT: 47 % (ref 39.0–52.0)
HEMOGLOBIN: 16 g/dL (ref 13.0–17.0)
POTASSIUM: 3.5 mmol/L (ref 3.5–5.1)
SODIUM: 140 mmol/L (ref 135–145)
TCO2: 27 mmol/L (ref 0–100)

## 2016-11-21 MED ORDER — SULFAMETHOXAZOLE-TRIMETHOPRIM 800-160 MG PO TABS: 1.0000 | ORAL_TABLET | Freq: Two times a day (BID) | ORAL | 0 refills | Status: DC

## 2016-11-21 MED ORDER — IOPAMIDOL (ISOVUE-300) INJECTION 61%
100.0000 mL | Freq: Once | INTRAVENOUS | Status: AC | PRN
  Administered 2016-11-21: 100 mL via INTRAVENOUS

## 2016-11-21 MED ORDER — IOPAMIDOL (ISOVUE-300) INJECTION 61%
INTRAVENOUS | Status: AC
  Administered 2016-11-21: 100 mL via INTRAVENOUS
  Filled 2016-11-21: qty 100

## 2016-11-21 MED ORDER — NAPROXEN 500 MG PO TABS: ORAL_TABLET | ORAL | 0 refills | Status: DC

## 2016-11-21 NOTE — Discharge Instructions (Signed)
Use ice over the swollen area. Take the naproxen for pain. Take the antibiotics until gone.  Follow up with Dr. Orlan Leavensrtman if you aren't improving over the next week or if it gets worse (increased swelling, pain, fever).

## 2016-11-21 NOTE — ED Triage Notes (Signed)
Patient reports he had left elbow surgery in May and noticed a lump on his anterior forearm about 2 days ago. The area is not red or inflammed.
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
## 2016-12-19 ENCOUNTER — Encounter (HOSPITAL_COMMUNITY): Payer: Self-pay | Admitting: *Deleted

## 2016-12-19 MED ORDER — CLINDAMYCIN PHOSPHATE 900 MG/50ML IV SOLN
900.0000 mg | INTRAVENOUS | Status: AC
  Administered 2016-12-22: 900 mg via INTRAVENOUS
  Filled 2016-12-19: qty 50

## 2016-12-19 NOTE — Progress Notes (Signed)
Pt denies SOB, chest pain, and being under the care of a cardiologist. Pt denies having a stress test, echo and cardiac cath. Pt denies having recent labs. Pt made aware to stop taking  Aspirin, vitamins, fish oil and herbal medications. Do not take any NSAIDs ie: Ibuprofen, Advil, Naproxen (Aleve), Motrin, BC and Goody Powder or any medication containing Aspirin. Pt verbalized understanding of all pre-op instructions.

## 2016-12-22 ENCOUNTER — Encounter (HOSPITAL_COMMUNITY)
Admission: RE | Disposition: A | Discharge status: Home / Self Care | Payer: Self-pay | Source: Ambulatory Visit | Attending: Orthopedic Surgery

## 2016-12-22 ENCOUNTER — Ambulatory Visit (HOSPITAL_COMMUNITY): Payer: Self-pay | Admitting: Anesthesiology

## 2016-12-22 ENCOUNTER — Ambulatory Visit (HOSPITAL_COMMUNITY)
Admission: RE | Admit: 2016-12-22 | Discharge: 2016-12-22 | Disposition: A | Discharge status: Home / Self Care | Payer: Self-pay | Source: Ambulatory Visit | Attending: Orthopedic Surgery | Admitting: Orthopedic Surgery

## 2016-12-22 ENCOUNTER — Encounter (HOSPITAL_COMMUNITY): Payer: Self-pay

## 2016-12-22 DIAGNOSIS — Z87891 Personal history of nicotine dependence: Secondary | ICD-10-CM | POA: Insufficient documentation

## 2016-12-22 DIAGNOSIS — M795 Residual foreign body in soft tissue: Secondary | ICD-10-CM | POA: Insufficient documentation

## 2016-12-22 DIAGNOSIS — F909 Attention-deficit hyperactivity disorder, unspecified type: Secondary | ICD-10-CM | POA: Insufficient documentation

## 2016-12-22 DIAGNOSIS — S51822A Laceration with foreign body of left forearm, initial encounter: Secondary | ICD-10-CM

## 2016-12-22 DIAGNOSIS — Z880 Allergy status to penicillin: Secondary | ICD-10-CM | POA: Insufficient documentation

## 2016-12-22 DIAGNOSIS — J45909 Unspecified asthma, uncomplicated: Secondary | ICD-10-CM | POA: Insufficient documentation

## 2016-12-22 DIAGNOSIS — F1290 Cannabis use, unspecified, uncomplicated: Secondary | ICD-10-CM | POA: Insufficient documentation

## 2016-12-22 HISTORY — PX: FOREIGN BODY REMOVAL: SHX962

## 2016-12-22 HISTORY — DX: Residual foreign body in soft tissue: M79.5

## 2016-12-22 LAB — COMPREHENSIVE METABOLIC PANEL
ALK PHOS: 74 U/L (ref 38–126)
ALT: 23 U/L (ref 17–63)
AST: 23 U/L (ref 15–41)
Albumin: 4.4 g/dL (ref 3.5–5.0)
Anion gap: 9 (ref 5–15)
BUN: 15 mg/dL (ref 6–20)
CO2: 25 mmol/L (ref 22–32)
CREATININE: 0.87 mg/dL (ref 0.61–1.24)
Calcium: 9.4 mg/dL (ref 8.9–10.3)
Chloride: 105 mmol/L (ref 101–111)
Glucose, Bld: 95 mg/dL (ref 65–99)
Potassium: 3.6 mmol/L (ref 3.5–5.1)
Sodium: 139 mmol/L (ref 135–145)
Total Bilirubin: 1 mg/dL (ref 0.3–1.2)
Total Protein: 6.8 g/dL (ref 6.5–8.1)

## 2016-12-22 LAB — CBC
HCT: 46 % (ref 39.0–52.0)
Hemoglobin: 16.5 g/dL (ref 13.0–17.0)
MCH: 31.4 pg (ref 26.0–34.0)
MCHC: 35.9 g/dL (ref 30.0–36.0)
MCV: 87.6 fL (ref 78.0–100.0)
Platelets: 227 10*3/uL (ref 150–400)
RBC: 5.25 MIL/uL (ref 4.22–5.81)
RDW: 12.1 % (ref 11.5–15.5)
WBC: 9.1 10*3/uL (ref 4.0–10.5)

## 2016-12-22 SURGERY — REMOVAL FOREIGN BODY EXTREMITY
Anesthesia: General | Laterality: Left

## 2016-12-22 MED ORDER — LACTATED RINGERS IV SOLN
INTRAVENOUS | Status: DC
  Administered 2016-12-22: 16:00:00 via INTRAVENOUS

## 2016-12-22 MED ORDER — HYDROMORPHONE HCL 1 MG/ML IJ SOLN: 0.2500 mg | INTRAMUSCULAR | Status: DC | PRN

## 2016-12-22 MED ORDER — 0.9 % SODIUM CHLORIDE (POUR BTL) OPTIME
TOPICAL | Status: DC | PRN
  Administered 2016-12-22: 1000 mL

## 2016-12-22 MED ORDER — ONDANSETRON HCL 4 MG/2ML IJ SOLN: 4.0000 mg | Freq: Once | INTRAMUSCULAR | Status: DC | PRN

## 2016-12-22 MED ORDER — DEXAMETHASONE SODIUM PHOSPHATE 10 MG/ML IJ SOLN
INTRAMUSCULAR | Status: AC
  Filled 2016-12-22: qty 2

## 2016-12-22 MED ORDER — PROPOFOL 10 MG/ML IV BOLUS
INTRAVENOUS | Status: AC
  Filled 2016-12-22: qty 20

## 2016-12-22 MED ORDER — FENTANYL CITRATE (PF) 100 MCG/2ML IJ SOLN: 25.0000 ug | INTRAMUSCULAR | Status: DC | PRN

## 2016-12-22 MED ORDER — HYDROCODONE-ACETAMINOPHEN 5-325 MG PO TABS
1.0000 | ORAL_TABLET | Freq: Once | ORAL | Status: AC
  Administered 2016-12-22: 1 via ORAL

## 2016-12-22 MED ORDER — PROMETHAZINE HCL 25 MG/ML IJ SOLN: 6.2500 mg | INTRAMUSCULAR | Status: DC | PRN

## 2016-12-22 MED ORDER — HYDROCODONE-ACETAMINOPHEN 5-325 MG PO TABS
ORAL_TABLET | ORAL | Status: AC
  Filled 2016-12-22: qty 1

## 2016-12-22 MED ORDER — CHLORHEXIDINE GLUCONATE 4 % EX LIQD: 60.0000 mL | Freq: Once | CUTANEOUS | Status: DC

## 2016-12-22 MED ORDER — MEPERIDINE HCL 25 MG/ML IJ SOLN: 6.2500 mg | INTRAMUSCULAR | Status: DC | PRN

## 2016-12-22 MED ORDER — HYDROCODONE-ACETAMINOPHEN 5-300 MG PO TABS: 1.0000 | ORAL_TABLET | Freq: Two times a day (BID) | ORAL | 0 refills | Status: DC | PRN

## 2016-12-22 MED ORDER — ONDANSETRON HCL 4 MG/2ML IJ SOLN
INTRAMUSCULAR | Status: AC
  Filled 2016-12-22: qty 6

## 2016-12-22 MED ORDER — MIDAZOLAM HCL 5 MG/5ML IJ SOLN
INTRAMUSCULAR | Status: DC | PRN
  Administered 2016-12-22: 2 mg via INTRAVENOUS

## 2016-12-22 MED ORDER — LIDOCAINE HCL (CARDIAC) 20 MG/ML IV SOLN
INTRAVENOUS | Status: AC
  Filled 2016-12-22: qty 10

## 2016-12-22 MED ORDER — LIDOCAINE 2% (20 MG/ML) 5 ML SYRINGE
INTRAMUSCULAR | Status: DC | PRN
  Administered 2016-12-22: 100 mg via INTRAVENOUS

## 2016-12-22 MED ORDER — FENTANYL CITRATE (PF) 100 MCG/2ML IJ SOLN
INTRAMUSCULAR | Status: DC | PRN
  Administered 2016-12-22: 50 ug via INTRAVENOUS
  Administered 2016-12-22: 100 ug via INTRAVENOUS

## 2016-12-22 MED ORDER — PHENYLEPHRINE 40 MCG/ML (10ML) SYRINGE FOR IV PUSH (FOR BLOOD PRESSURE SUPPORT)
PREFILLED_SYRINGE | INTRAVENOUS | Status: AC
  Filled 2016-12-22: qty 20

## 2016-12-22 MED ORDER — MIDAZOLAM HCL 2 MG/2ML IJ SOLN
INTRAMUSCULAR | Status: AC
  Filled 2016-12-22: qty 2

## 2016-12-22 MED ORDER — DEXAMETHASONE SODIUM PHOSPHATE 10 MG/ML IJ SOLN
INTRAMUSCULAR | Status: DC | PRN
  Administered 2016-12-22: 5 mg via INTRAVENOUS

## 2016-12-22 MED ORDER — FENTANYL CITRATE (PF) 250 MCG/5ML IJ SOLN
INTRAMUSCULAR | Status: AC
  Filled 2016-12-22: qty 5

## 2016-12-22 MED ORDER — PROPOFOL 10 MG/ML IV BOLUS
INTRAVENOUS | Status: DC | PRN
  Administered 2016-12-22: 200 mg via INTRAVENOUS

## 2016-12-22 SURGICAL SUPPLY — 59 items
BANDAGE ACE 3X5.8 VEL STRL LF (GAUZE/BANDAGES/DRESSINGS) ×3 IMPLANT
BANDAGE ACE 4X5 VEL STRL LF (GAUZE/BANDAGES/DRESSINGS) ×3 IMPLANT
BNDG COHESIVE 1X5 TAN STRL LF (GAUZE/BANDAGES/DRESSINGS) IMPLANT
BNDG CONFORM 2 STRL LF (GAUZE/BANDAGES/DRESSINGS) IMPLANT
BNDG ESMARK 4X9 LF (GAUZE/BANDAGES/DRESSINGS) ×3 IMPLANT
BNDG GAUZE ELAST 4 BULKY (GAUZE/BANDAGES/DRESSINGS) ×3 IMPLANT
CLOSURE STERI-STRIP 1/2X4 (GAUZE/BANDAGES/DRESSINGS) ×1
CLSR STERI-STRIP ANTIMIC 1/2X4 (GAUZE/BANDAGES/DRESSINGS) ×2 IMPLANT
CORDS BIPOLAR (ELECTRODE) ×3 IMPLANT
COVER SURGICAL LIGHT HANDLE (MISCELLANEOUS) ×3 IMPLANT
CUFF TOURNIQUET SINGLE 18IN (TOURNIQUET CUFF) ×3 IMPLANT
CUFF TOURNIQUET SINGLE 24IN (TOURNIQUET CUFF) IMPLANT
DRAIN PENROSE 1/4X12 LTX STRL (WOUND CARE) IMPLANT
DRAPE SURG 17X23 STRL (DRAPES) ×3 IMPLANT
DRSG ADAPTIC 3X8 NADH LF (GAUZE/BANDAGES/DRESSINGS) ×3 IMPLANT
DRSG TEGADERM 4X4.75 (GAUZE/BANDAGES/DRESSINGS) ×3 IMPLANT
ELECT REM PT RETURN 9FT ADLT (ELECTROSURGICAL)
ELECTRODE REM PT RTRN 9FT ADLT (ELECTROSURGICAL) IMPLANT
GAUZE SPONGE 4X4 12PLY STRL (GAUZE/BANDAGES/DRESSINGS) ×3 IMPLANT
GAUZE SPONGE 4X4 16PLY XRAY LF (GAUZE/BANDAGES/DRESSINGS) ×3 IMPLANT
GAUZE XEROFORM 1X8 LF (GAUZE/BANDAGES/DRESSINGS) ×3 IMPLANT
GAUZE XEROFORM 5X9 LF (GAUZE/BANDAGES/DRESSINGS) IMPLANT
GLOVE BIOGEL PI IND STRL 8.5 (GLOVE) ×1 IMPLANT
GLOVE BIOGEL PI INDICATOR 8.5 (GLOVE) ×2
GLOVE SURG ORTHO 8.0 STRL STRW (GLOVE) ×3 IMPLANT
GOWN STRL REUS W/ TWL LRG LVL3 (GOWN DISPOSABLE) ×3 IMPLANT
GOWN STRL REUS W/ TWL XL LVL3 (GOWN DISPOSABLE) ×1 IMPLANT
GOWN STRL REUS W/TWL LRG LVL3 (GOWN DISPOSABLE) ×6
GOWN STRL REUS W/TWL XL LVL3 (GOWN DISPOSABLE) ×2
HANDPIECE INTERPULSE COAX TIP (DISPOSABLE)
KIT BASIN OR (CUSTOM PROCEDURE TRAY) ×3 IMPLANT
KIT ROOM TURNOVER OR (KITS) ×3 IMPLANT
MANIFOLD NEPTUNE II (INSTRUMENTS) IMPLANT
NEEDLE HYPO 25GX1X1/2 BEV (NEEDLE) IMPLANT
NS IRRIG 1000ML POUR BTL (IV SOLUTION) ×3 IMPLANT
PACK ORTHO EXTREMITY (CUSTOM PROCEDURE TRAY) ×3 IMPLANT
PAD ARMBOARD 7.5X6 YLW CONV (MISCELLANEOUS) ×6 IMPLANT
PAD CAST 4YDX4 CTTN HI CHSV (CAST SUPPLIES) ×1 IMPLANT
PADDING CAST COTTON 4X4 STRL (CAST SUPPLIES) ×2
SET HNDPC FAN SPRY TIP SCT (DISPOSABLE) IMPLANT
SOAP 2 % CHG 4 OZ (WOUND CARE) ×3 IMPLANT
SPONGE LAP 18X18 X RAY DECT (DISPOSABLE) ×3 IMPLANT
SPONGE LAP 4X18 X RAY DECT (DISPOSABLE) ×3 IMPLANT
SUCTION FRAZIER HANDLE 10FR (MISCELLANEOUS) ×2
SUCTION TUBE FRAZIER 10FR DISP (MISCELLANEOUS) ×1 IMPLANT
SUT ETHILON 4 0 PS 2 18 (SUTURE) IMPLANT
SUT ETHILON 5 0 P 3 18 (SUTURE)
SUT MNCRL AB 4-0 PS2 18 (SUTURE) ×3 IMPLANT
SUT NYLON ETHILON 5-0 P-3 1X18 (SUTURE) IMPLANT
SUT PROLENE 4 0 PS 2 18 (SUTURE) ×3 IMPLANT
SWAB CULTURE ESWAB REG 1ML (MISCELLANEOUS) IMPLANT
SYR CONTROL 10ML LL (SYRINGE) IMPLANT
TOWEL OR 17X24 6PK STRL BLUE (TOWEL DISPOSABLE) ×3 IMPLANT
TOWEL OR 17X26 10 PK STRL BLUE (TOWEL DISPOSABLE) ×3 IMPLANT
TUBE CONNECTING 12'X1/4 (SUCTIONS) ×1
TUBE CONNECTING 12X1/4 (SUCTIONS) ×2 IMPLANT
UNDERPAD 30X30 (UNDERPADS AND DIAPERS) ×3 IMPLANT
WATER STERILE IRR 1000ML POUR (IV SOLUTION) IMPLANT
YANKAUER SUCT BULB TIP NO VENT (SUCTIONS) ×3 IMPLANT

## 2016-12-22 NOTE — Anesthesia Preprocedure Evaluation (Signed)
Anesthesia Evaluation  Patient identified by MRN, date of birth, ID band Patient awake    Reviewed: Allergy & Precautions, NPO status , Patient's Chart, lab work & pertinent test results  Airway Mallampati: I  TM Distance: >3 FB Neck ROM: Full    Dental  (+) Teeth Intact, Dental Advisory Given   Pulmonary asthma , former smoker,    Pulmonary exam normal breath sounds clear to auscultation       Cardiovascular Exercise Tolerance: Good negative cardio ROS Normal cardiovascular exam Rhythm:Regular Rate:Normal     Neuro/Psych negative neurological ROS     GI/Hepatic negative GI ROS, (+)     substance abuse  marijuana use,   Endo/Other  negative endocrine ROS  Renal/GU negative Renal ROS     Musculoskeletal negative musculoskeletal ROS (+)   Abdominal   Peds  (+) ADHD Hematology negative hematology ROS (+)   Anesthesia Other Findings Day of surgery medications reviewed with the patient.  Reproductive/Obstetrics                             Anesthesia Physical  Anesthesia Plan  ASA: I  Anesthesia Plan: General   Post-op Pain Management:    Induction: Intravenous  PONV Risk Score and Plan: 2 and Ondansetron, Dexamethasone and Treatment may vary due to age or medical condition  Airway Management Planned: LMA  Additional Equipment:   Intra-op Plan:   Post-operative Plan: Extubation in OR  Informed Consent: I have reviewed the patients History and Physical, chart, labs and discussed the procedure including the risks, benefits and alternatives for the proposed anesthesia with the patient or authorized representative who has indicated his/her understanding and acceptance.   Dental advisory given  Plan Discussed with: CRNA  Anesthesia Plan Comments: (Risks/benefits of general anesthesia discussed with patient including risk of damage to teeth, lips, gum, and tongue,  nausea/vomiting, allergic reactions to medications, and the possibility of heart attack, stroke and death.  All patient questions answered.  Patient wishes to proceed.)        Anesthesia Quick Evaluation

## 2016-12-22 NOTE — Anesthesia Procedure Notes (Signed)
Procedure Name: LMA Insertion Date/Time: 12/22/2016 4:22 PM Performed by: Charm BargesBUTLER, Tiajuana Leppanen R Pre-anesthesia Checklist: Patient identified, Emergency Drugs available, Suction available and Patient being monitored Patient Re-evaluated:Patient Re-evaluated prior to induction Oxygen Delivery Method: Circle System Utilized Preoxygenation: Pre-oxygenation with 100% oxygen Induction Type: IV induction Ventilation: Mask ventilation without difficulty LMA: LMA inserted LMA Size: 5.0 Number of attempts: 1 Placement Confirmation: positive ETCO2 Tube secured with: Tape Dental Injury: Teeth and Oropharynx as per pre-operative assessment

## 2016-12-22 NOTE — Addendum Note (Signed)
Addendum  created 12/22/16 1751 by Shireen QuanButler, Jonesha Tsuchiya R, CRNA   Anesthesia Event edited

## 2016-12-22 NOTE — Transfer of Care (Signed)
Immediate Anesthesia Transfer of Care Note  Patient: Shane Wheeler  Procedure(s) Performed: Procedure(s): LEFT FOREARM FOREIGN BODY EXCISION (Left)  Patient Location: PACU  Anesthesia Type:General  Level of Consciousness: awake, oriented and patient cooperative  Airway & Oxygen Therapy: Patient Spontanous Breathing and Patient connected to nasal cannula oxygen  Post-op Assessment: Report given to RN, Post -op Vital signs reviewed and stable and Patient moving all extremities  Post vital signs: Reviewed and stable  Last Vitals:  Vitals:   12/22/16 1416  BP: (!) 141/75  Pulse: 64  Resp: 18  Temp: 36.5 C    Last Pain:  Vitals:   12/22/16 1416  TempSrc: Oral      Patients Stated Pain Goal: 2 (12/22/16 1436)  Complications: No apparent anesthesia complications

## 2016-12-22 NOTE — Discharge Instructions (Signed)
KEEP BANDAGE CLEAN AND DRY °CALL OFFICE FOR F/U APPT [phone] IN 10 DAYS °KEEP HAND ELEVATED ABOVE HEART °OK TO APPLY ICE TO OPERATIVE AREA °CONTACT OFFICE IF ANY WORSENING PAIN OR CONCERNS. °

## 2016-12-22 NOTE — Anesthesia Postprocedure Evaluation (Signed)
Anesthesia Post Note  Patient: Shane Clifton CustardAaron Wheeler  Procedure(s) Performed: Procedure(s) (LRB): LEFT FOREARM FOREIGN BODY EXCISION (Left)     Patient location during evaluation: PACU Anesthesia Type: General Level of consciousness: awake Pain management: pain level controlled Vital Signs Assessment: post-procedure vital signs reviewed and stable Cardiovascular status: stable Anesthetic complications: no    Last Vitals:  Vitals:   12/22/16 1700 12/22/16 1715  BP: 136/72 137/84  Pulse: (!) 57 66  Resp: 12 18  Temp: (!) 36.2 C     Last Pain:  Vitals:   12/22/16 1720  TempSrc:   PainSc: 0-No pain                 Kye Silverstein
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
## 2016-12-23 ENCOUNTER — Encounter (HOSPITAL_COMMUNITY): Payer: Self-pay | Admitting: Orthopedic Surgery

## 2017-02-19 ENCOUNTER — Encounter: Payer: Self-pay | Admitting: Orthopedic Surgery

## 2017-03-06 ENCOUNTER — Emergency Department (HOSPITAL_COMMUNITY)
Admission: EM | Admit: 2017-03-06 | Discharge: 2017-03-06 | Disposition: A | Discharge status: Home / Self Care | Payer: Self-pay | Attending: Emergency Medicine | Admitting: Emergency Medicine

## 2017-03-06 ENCOUNTER — Encounter (HOSPITAL_COMMUNITY): Payer: Self-pay | Admitting: Emergency Medicine

## 2017-03-06 DIAGNOSIS — Z87891 Personal history of nicotine dependence: Secondary | ICD-10-CM | POA: Insufficient documentation

## 2017-03-06 DIAGNOSIS — J45909 Unspecified asthma, uncomplicated: Secondary | ICD-10-CM | POA: Insufficient documentation

## 2017-03-06 DIAGNOSIS — F909 Attention-deficit hyperactivity disorder, unspecified type: Secondary | ICD-10-CM | POA: Insufficient documentation

## 2017-03-06 DIAGNOSIS — K029 Dental caries, unspecified: Secondary | ICD-10-CM | POA: Insufficient documentation

## 2017-03-06 MED ORDER — IBUPROFEN 200 MG PO TABS
400.0000 mg | ORAL_TABLET | Freq: Once | ORAL | Status: AC | PRN
Start: 2017-03-06 — End: 2017-03-06
  Administered 2017-03-06: 400 mg via ORAL
  Filled 2017-03-06: qty 2

## 2017-03-06 MED ORDER — CLINDAMYCIN HCL 300 MG PO CAPS: 300.0000 mg | ORAL_CAPSULE | Freq: Four times a day (QID) | ORAL | 0 refills | Status: DC

## 2017-03-06 NOTE — ED Provider Notes (Signed)
[facility]-EMERGENCY DEPT Provider Note   [name]: [id number] Arrival date & time: 03/06/17  0553     History   Chief Complaint Chief Complaint  Patient presents with  . Dental Pain    HPI Shane Wheeler is a 31 y.o. male.  Patient is a 31 year old malehistory presents with dental pain. He states he's had problems with his left upper back molar for about a year. He states it's slowly breaking off and is been causing him more pain over the last couple weeks. He's been trying to get in to see Dentist but has been unsuccessful in this endeavor. He denies any facial swelling. No fevers. No drainage from the tooth. He's been using over-the-counter medications without improvement in symptoms.      Past Medical History:  Diagnosis Date  . ADHD (attention deficit hyperactivity disorder)   . Asthma    as a child  . Foreign body (FB) in soft tissue    left elbow  . History of Meckel's diverticulum     Patient Active Problem List   Diagnosis Date Noted  . Left lower quadrant pain 09/25/2016    Past Surgical History:  Procedure Laterality Date  . APPENDECTOMY    . arm surgery Right   . FOREIGN BODY REMOVAL Left 11/05/2016   Procedure: Left elbow wound exploration and foreign body removal;  Surgeon: Bradly Bienenstock, MD;  Location: [facility] OR;  Service: Orthopedics;  Laterality: Left;  . FOREIGN BODY REMOVAL Left 12/22/2016   Procedure: LEFT FOREARM FOREIGN BODY EXCISION;  Surgeon: Bradly Bienenstock, MD;  Location: [facility] OR;  Service: Orthopedics;  Laterality: Left;  . TONSILLECTOMY         Home Medications    Prior to Admission medications   Medication Sig Start Date End Date Taking? Authorizing Provider  clindamycin (CLEOCIN) 300 MG capsule Take 1 capsule (300 mg total) by mouth 4 (four) times daily. X 7 days 03/06/17   Rolan Bucco, MD  Hydrocodone-Acetaminophen (VICODIN) 5-300 MG TABS Take 1 tablet by mouth 2 (two) times daily as needed (PAIN). 12/22/16   Bradly Bienenstock, MD  naproxen  (NAPROSYN) 500 MG tablet Take 1 po BID with food prn pain Patient not taking: Reported on 12/18/2016 11/21/16   Devoria Albe, MD    Family History History reviewed. No pertinent family history.  Social History Social History  Substance Use Topics  . Smoking status: Former Smoker    Types: Cigarettes  . Smokeless tobacco: Never Used  . Alcohol use No     Comment: last use Feb 2018     Allergies   Augmentin [amoxicillin-pot clavulanate]   Review of Systems Review of Systems  Constitutional: Negative for fever.  HENT: Positive for dental problem.   Gastrointestinal: Negative for nausea and vomiting.  Skin: Negative for wound.  Neurological: Positive for headaches.     Physical Exam Updated Vital Signs BP (!) 146/89 (BP Location: Right Arm)   Pulse 71   Temp 98.4 F (36.9 C) (Oral)   Resp 18   Ht  (1.778 m)   Wt 90.7 kg (200 lb)   SpO2 98%   BMI 28.70 kg/m   Physical Exam  Constitutional: He appears well-developed and well-nourished.  HENT:  Patient has a decayed tooth involving the left upper back molar. There some minimal swelling to the area. No trismus. No induration or fluctuance. No elevation the tongue.     ED Treatments / Results  Labs (all labs ordered are listed, but only [id number] results  are displayed) Labs Reviewed - No data to display  EKG  EKG Interpretation None       Radiology No results found.  Procedures Procedures (including critical care time)  Medications Ordered in ED Medications  ibuprofen (ADVIL,MOTRIN) tablet 400 mg (400 mg Oral Given 03/06/17 0725)     Initial Impression / Assessment and Plan / ED Course  I have reviewed the triage vital signs and the nursing notes.  Pertinent labs & imaging results that were available during my care of the patient were reviewed by me and considered in my medical decision making (see chart for details).     Patient presents with dental pain. There is no suggestions of a dental  abscess. He was started on clindamycin. He can continue using over-the-counter medications for symptomatically. I did offer him a dental block but he declined this. He was given a list of dental resources for outpatient follow-up. Return precautions were given.  Final Clinical Impressions(s) / ED Diagnoses   Final diagnoses:  Pain due to dental caries    New Prescriptions New Prescriptions   CLINDAMYCIN (CLEOCIN) 300 MG CAPSULE    Take 1 capsule (300 mg total) by mouth 4 (four) times daily. X 7 days     Rolan Bucco, MD 03/06/17 [phone]

## 2017-03-06 NOTE — ED Triage Notes (Signed)
Patient has a broken tooth on the left upper back side of mouth. Patient states this has been like this for a while but can not take it anymore.

## 2019-04-05 ENCOUNTER — Encounter (HOSPITAL_BASED_OUTPATIENT_CLINIC_OR_DEPARTMENT_OTHER): Payer: Self-pay | Admitting: *Deleted

## 2019-04-05 ENCOUNTER — Other Ambulatory Visit: Payer: Self-pay

## 2019-04-05 ENCOUNTER — Emergency Department (HOSPITAL_BASED_OUTPATIENT_CLINIC_OR_DEPARTMENT_OTHER): Payer: Self-pay

## 2019-04-05 DIAGNOSIS — Z87891 Personal history of nicotine dependence: Secondary | ICD-10-CM | POA: Insufficient documentation

## 2019-04-05 DIAGNOSIS — R042 Hemoptysis: Secondary | ICD-10-CM | POA: Insufficient documentation

## 2019-04-05 DIAGNOSIS — J45909 Unspecified asthma, uncomplicated: Secondary | ICD-10-CM | POA: Insufficient documentation

## 2019-04-05 DIAGNOSIS — R21 Rash and other nonspecific skin eruption: Secondary | ICD-10-CM | POA: Insufficient documentation

## 2019-04-05 LAB — COMPREHENSIVE METABOLIC PANEL
ALT: 27 U/L (ref 0–44)
AST: 26 U/L (ref 15–41)
Albumin: 5.2 g/dL — ABNORMAL HIGH (ref 3.5–5.0)
Alkaline Phosphatase: 81 U/L (ref 38–126)
Anion gap: 13 (ref 5–15)
BUN: 10 mg/dL (ref 6–20)
CO2: 24 mmol/L (ref 22–32)
Calcium: 9.6 mg/dL (ref 8.9–10.3)
Chloride: 97 mmol/L — ABNORMAL LOW (ref 98–111)
Creatinine, Ser: 0.98 mg/dL (ref 0.61–1.24)
GFR calc Af Amer: >60 mL/min (ref >60)
GFR calc non Af Amer: >60 mL/min (ref >60)
Glucose, Bld: 82 mg/dL (ref 70–99)
Potassium: 3.7 mmol/L (ref 3.5–5.1)
Sodium: 134 mmol/L — ABNORMAL LOW (ref 135–145)
Total Bilirubin: 1.2 mg/dL (ref 0.3–1.2)
Total Protein: 7.6 g/dL (ref 6.5–8.1)

## 2019-04-05 LAB — CBC WITH DIFFERENTIAL/PLATELET
Abs Immature Granulocytes: 0.02 10*3/uL (ref 0.00–0.07)
Basophils Absolute: 0 10*3/uL (ref 0.0–0.1)
Basophils Relative: 0 %
Eosinophils Absolute: 0.1 10*3/uL (ref 0.0–0.5)
Eosinophils Relative: 1 %
HCT: 48.2 % (ref 39.0–52.0)
Hemoglobin: 16.6 g/dL (ref 13.0–17.0)
Immature Granulocytes: 0 %
Lymphocytes Relative: 39 %
Lymphs Abs: 3.9 10*3/uL (ref 0.7–4.0)
MCH: 30.2 pg (ref 26.0–34.0)
MCHC: 34.4 g/dL (ref 30.0–36.0)
MCV: 87.8 fL (ref 80.0–100.0)
Monocytes Absolute: 0.8 10*3/uL (ref 0.1–1.0)
Monocytes Relative: 8 %
Neutro Abs: 5.3 10*3/uL (ref 1.7–7.7)
Neutrophils Relative %: 52 %
Platelets: 245 10*3/uL (ref 150–400)
RBC: 5.49 MIL/uL (ref 4.22–5.81)
RDW: 11.9 % (ref 11.5–15.5)
WBC: 10.1 10*3/uL (ref 4.0–10.5)
nRBC: 0 % (ref 0.0–0.2)

## 2019-04-05 LAB — TROPONIN I (HIGH SENSITIVITY): Troponin I (High Sensitivity): 4 ng/L (ref <18)

## 2019-04-05 NOTE — ED Triage Notes (Signed)
Pt c/o Chest pain and blood tinge sputum x 6 months

## 2019-04-05 NOTE — ED Notes (Signed)
Patient transported to X-ray 

## 2019-04-06 ENCOUNTER — Emergency Department (HOSPITAL_BASED_OUTPATIENT_CLINIC_OR_DEPARTMENT_OTHER): Payer: Self-pay

## 2019-04-06 ENCOUNTER — Emergency Department (HOSPITAL_BASED_OUTPATIENT_CLINIC_OR_DEPARTMENT_OTHER)
Admission: EM | Admit: 2019-04-06 | Discharge: 2019-04-06 | Disposition: A | Discharge status: Home / Self Care | Payer: Self-pay | Attending: Emergency Medicine | Admitting: Emergency Medicine

## 2019-04-06 DIAGNOSIS — R042 Hemoptysis: Secondary | ICD-10-CM

## 2019-04-06 DIAGNOSIS — R21 Rash and other nonspecific skin eruption: Secondary | ICD-10-CM

## 2019-04-06 MED ORDER — NYSTATIN 100000 UNIT/GM EX CREA: 1.0000 "application " | TOPICAL_CREAM | Freq: Four times a day (QID) | CUTANEOUS | 1 refills | Status: DC

## 2019-04-06 NOTE — ED Notes (Signed)
ED Provider at bedside. 

## 2019-04-06 NOTE — ED Notes (Signed)
Patient transported to CT 

## 2019-04-06 NOTE — ED Provider Notes (Signed)
Emergency Department Provider Note   I have reviewed the triage vital signs and the nursing notes.   HISTORY  Chief Complaint Chest Pain   HPI Shane Wheeler is a 33 y.o. male who presents to the emergency department today secondary to months worth of abnormal cough with blood-tinged sputum.  Patient states that this has been happening quite a bit but then the last few days has been more consistent with more blood.  He admits to marijuana use but does not smoke cigarettes.  He has no fevers or night sweats.  He has had decreased energy and has lost 30 pounds in the last 6 weeks.  He is on a low carb diet but is unsure that it is the full cause of his weight loss.   No other associated or modifying symptoms.    Past Medical History:  Diagnosis Date  . ADHD (attention deficit hyperactivity disorder)   . Asthma    as a child  . Foreign body (FB) in soft tissue    left elbow  . History of Meckel's diverticulum     Patient Active Problem List   Diagnosis Date Noted  . Left lower quadrant pain 09/25/2016    Past Surgical History:  Procedure Laterality Date  . APPENDECTOMY    . arm surgery Right   . FOREIGN BODY REMOVAL Left 11/05/2016   Procedure: Left elbow wound exploration and foreign body removal;  Surgeon: Bradly Bienenstock, MD;  Location: [facility] OR;  Service: Orthopedics;  Laterality: Left;  . FOREIGN BODY REMOVAL Left 12/22/2016   Procedure: LEFT FOREARM FOREIGN BODY EXCISION;  Surgeon: Bradly Bienenstock, MD;  Location: [facility] OR;  Service: Orthopedics;  Laterality: Left;  . TONSILLECTOMY      Current Outpatient Rx  . Order #: [id number] Class: Normal  . Order #: [id number] Class: Print  . Order #: [id number] Class: Normal    Allergies Augmentin [amoxicillin-pot clavulanate]  History reviewed. No pertinent family history.  Social History Social History   Tobacco Use  . Smoking status: Former Smoker    Types: Cigarettes  . Smokeless tobacco: Never Used  Substance Use  Topics  . Alcohol use: No    Comment: last use Feb 2018  . Drug use: Yes    Types: Marijuana    Comment: 1-2 a week; last use June 2018    Review of Systems  All other systems negative except as documented in the HPI. All pertinent positives and negatives as reviewed in the HPI. ____________________________________________   PHYSICAL EXAM:  VITAL SIGNS: ED Triage Vitals  Enc Vitals Group     BP 04/05/19 2313 130/82     Pulse Rate 04/05/19 2313 82     Resp 04/05/19 2313 16     Temp 04/05/19 2313 98.1 F (36.7 C)     Temp src --      SpO2 04/05/19 2313 100 %     Weight 04/05/19 2310 204 lb (92.5 kg)     Height 04/05/19 2310 5\' 10"  (1.778 m)    Constitutional: Alert and oriented. Well appearing and in no acute distress. Eyes: Conjunctivae are normal. PERRL. EOMI. Head: Atraumatic. Nose: No congestion/rhinnorhea. Mouth/Throat: Mucous membranes are moist.  Oropharynx non-erythematous. Neck: No stridor.  No meningeal signs.   Cardiovascular: Normal rate, regular rhythm. Good peripheral circulation. Grossly normal heart sounds.   Respiratory: Normal respiratory effort.  No retractions. Lungs CTAB. Gastrointestinal: Soft and nontender. No distention.  Musculoskeletal: No lower extremity tenderness nor edema. No gross deformities of extremities.  Neurologic:  Normal speech and language. No gross focal neurologic deficits are appreciated.  Skin:  Skin is warm, dry and intact. Small silvery dime size area to left posterior shoulder.   ____________________________________________   LABS (all labs ordered are listed, but only abnormal results are displayed)  Labs Reviewed  COMPREHENSIVE METABOLIC PANEL - Abnormal; Notable for the following components:      Result Value   Sodium 134 (*)    Chloride 97 (*)    Albumin 5.2 (*)    All other components within normal limits  CBC WITH DIFFERENTIAL/PLATELET  TROPONIN I (HIGH SENSITIVITY)    ____________________________________________  EKG   EKG Interpretation  Date/Time:  Tuesday April 05 2019 23:11:53 EDT Ventricular Rate:  75 PR Interval:  172 QRS Duration: 88 QT Interval:  374 QTC Calculation: 417 R Axis:   41 Text Interpretation: Normal sinus rhythm with sinus arrhythmia Normal ECG No significant change since last tracing Confirmed by Merrily Pew [phone]) on 04/06/2019 1:24:56 AM      ____________________________________________  RADIOLOGY  Dg Chest 2 View  Result Date: 04/05/2019 CLINICAL DATA:  Chest pain EXAM: CHEST - 2 VIEW COMPARISON:  02/08/2016 FINDINGS: The heart size and mediastinal contours are within normal limits. Both lungs are clear. The visualized skeletal structures are unremarkable. IMPRESSION: Normal chest. Electronically Signed   By: Ulyses Jarred M.D.   On: 04/05/2019 23:45   Ct Chest Wo Contrast  Result Date: 04/06/2019 CLINICAL DATA:  Hemoptysis and chest pain EXAM: CT CHEST WITHOUT CONTRAST TECHNIQUE: Multidetector CT imaging of the chest was performed following the standard protocol without IV contrast. COMPARISON:  Plain film from the previous day, CT from 05/18/2016. FINDINGS: Cardiovascular: Somewhat limited due to lack of IV contrast. No aneurysmal dilatation is seen. No cardiac enlargement is noted. No coronary calcifications are seen. Mediastinum/Nodes: Thoracic inlet is within normal limits. Mild residual thymus tissue is noted in the anterior mediastinum. No hilar or mediastinal adenopathy is noted. The esophagus as visualized is within normal limits. Lungs/Pleura: Lungs are well aerated bilaterally. Tiny less than 5 mm nodule is noted in the left lower lobe best seen on image number 131 of series 3. A few other small nodules are noted in the lower lobe on the left. No focal infiltrate or sizable effusion is noted. Upper Abdomen: Visualized upper abdomen is unremarkable. Musculoskeletal: No acute bony abnormality is noted.  IMPRESSION: Left lower lobe nodules stable from 2017 and consistent with a benign etiology. No focal abnormality to correspond with the patient's given clinical history is seen. Electronically Signed   By: Inez Catalina M.D.   On: 04/06/2019 02:18    ____________________________________________   PROCEDURES  Procedure(s) performed:   Procedures   ____________________________________________   INITIAL IMPRESSION / ASSESSMENT AND PLAN / ED COURSE  Work-up negative for inflammation, neoplasm or any obvious autoimmune diseases.  Plan for pulmonary follow-up if not improving after stop smoking marijuana.  Also with likely eczema to the left shoulder.  Has some features of a possible fungal infection so we will give nystatin cream.     Pertinent labs & imaging results that were available during my care of the patient were reviewed by me and considered in my medical decision making (see chart for details).  A medical screening exam was performed and I feel the patient has had an appropriate workup for their chief complaint at this time and likelihood of emergent condition existing is low. They have been counseled on decision, discharge, follow up and  which symptoms necessitate immediate return to the emergency department. They or their family verbally stated understanding and agreement with plan and discharged in stable condition.   ____________________________________________  FINAL CLINICAL IMPRESSION(S) / ED DIAGNOSES  Final diagnoses:  Rash  Blood-tinged sputum     MEDICATIONS GIVEN DURING THIS VISIT:  Medications - No data to display   NEW OUTPATIENT MEDICATIONS STARTED DURING THIS VISIT:  Discharge Medication List as of 04/06/2019  3:03 AM    START taking these medications   Details  nystatin cream (MYCOSTATIN) Apply 1 application topically 4 (four) times daily. Apply to affected area every 4-6 hours x 10 days, Starting Wed 04/06/2019, Normal        Note:  This note  was prepared with assistance of Dragon voice recognition software. Occasional wrong-word or sound-a-like substitutions may have occurred due to the inherent limitations of voice recognition software.   Roxy Filler, Barbara CowerJason, MD 04/06/19 [phone]

## 2019-08-08 ENCOUNTER — Institutional Professional Consult (permissible substitution): Payer: Self-pay | Admitting: Internal Medicine

## 2020-03-02 ENCOUNTER — Emergency Department (HOSPITAL_COMMUNITY)
Admission: EM | Admit: 2020-03-02 | Discharge: 2020-03-02 | Disposition: A | Discharge status: Home / Self Care | Payer: HRSA Program | Attending: Emergency Medicine | Admitting: Emergency Medicine

## 2020-03-02 ENCOUNTER — Encounter (HOSPITAL_COMMUNITY): Payer: Self-pay

## 2020-03-02 ENCOUNTER — Other Ambulatory Visit: Payer: Self-pay

## 2020-03-02 DIAGNOSIS — Z87891 Personal history of nicotine dependence: Secondary | ICD-10-CM | POA: Diagnosis not present

## 2020-03-02 DIAGNOSIS — U071 COVID-19: Secondary | ICD-10-CM | POA: Insufficient documentation

## 2020-03-02 DIAGNOSIS — J45909 Unspecified asthma, uncomplicated: Secondary | ICD-10-CM | POA: Diagnosis not present

## 2020-03-02 DIAGNOSIS — R509 Fever, unspecified: Secondary | ICD-10-CM | POA: Diagnosis present

## 2020-03-02 DIAGNOSIS — B349 Viral infection, unspecified: Secondary | ICD-10-CM

## 2020-03-02 LAB — RESP PANEL BY RT PCR (RSV, FLU A&B, COVID)
Influenza A by PCR: NEGATIVE
Influenza B by PCR: NEGATIVE
Respiratory Syncytial Virus by PCR: NEGATIVE
SARS Coronavirus 2 by RT PCR: POSITIVE — AB

## 2020-03-02 NOTE — ED Triage Notes (Signed)
Patient c/o scratchy throat, but not sore, chills, generalized body aches, and a raised area on the right chest. Patient states he is concerned about skin cancer.

## 2020-03-02 NOTE — Discharge Instructions (Addendum)
You have been seen here for general body aches, sore throat, skin lesion.  Exam and vital signs all look reassuring.  I recommend taking over the counter pain medications like ibuprofen or Tylenol every 6 as needed please follow dosing the back of bottle.  I also recommend staying hydrated and eating. if you do not have an appetite I recommend soups as this will give you fluid as well as calories.  Your Covid test is pending I like you to self quarantine until you see results on MyChart.  If positive you must self quarantine for 10 days since symptom onset.  I also recommend you contact the "post Covid care" they can provide you with more information and treatment for your Covid.  I have given you the contact information for community health and wellness they work with individuals with little to no insurance can help you find a primary care provider.  Here is a list of all the dermatologist that you can contact [facility]:   [facility]  3.2 [phone])  Dermatologist  [address] [place] # [place]  [phone]   Dr. Mertha Finders, MD  2.6 (18)  Dermatologist  [address] [place]  [phone]  [facility]  3.5 (3)  [facility]  [address] [place]  [phone]   [facility]  4.0 (4)  Dermatologist  [address]  [phone]  Janalyn Harder MD  3.0 (2)  Dermatologist  [address]  [phone]  Hoyle Sauer  2.7 (6)  Dermatologist  [address] [place]  [phone]  [place] Amy Y MD  2.0 (1)  Dermatologist  [address] [place]  [phone]  [facility]  5.0 (3)  Doctor  [address]  [phone]    I want you to back to emergency department if develops chest pain, shortness of breath, severe abdominal pain, uncontrolled nausea, vomiting, diarrhea as these symptoms acquire further evaluation management.

## 2020-03-02 NOTE — ED Provider Notes (Signed)
[facility]-EMERGENCY DEPT Provider Note   [name]: [id number] Arrival date & time: 03/02/20  1558     History Chief Complaint  Patient presents with  . Chills  . Rash  . Generalized Body Aches    Shane Wheeler is a 33 y.o. male.  HPI   Patient with no significant medical history presents to the emergency department with chief complaint of scratchy throat, general body aches, subjective fevers and chills as well as a mark on his skin concern for cancer.  Patient states his scratchy throat and general body aches started on Tuesday and had progressively gotten worse.  He denies abdominal pain, nausea, vomiting, diarrhea, dysuria, shortness of breath, chest pain.  He states he recently traveled down to [place] and is not COVID vaccinated unsure if he possibly contact with someone.  He also is concerned for a skin mark on his right upper chest which he noticed a few months ago.  He states he has been scratching at it and will bleed, denies fevers, night sweats, unintentional weight loss, bone pain no history of cancer.  Patient denies seeing a dermatologist or a primary care doctor for evaluation.  Patient denies headache, ear pain, sore throat, cough, chest pain, shortness of breath, abdominal pain, nausea, vomiting, diarrhea, pedal edema.  Past Medical History:  Diagnosis Date  . ADHD (attention deficit hyperactivity disorder)   . Asthma    as a child  . Foreign body (FB) in soft tissue    left elbow  . History of Meckel's diverticulum     Patient Active Problem List   Diagnosis Date Noted  . Left lower quadrant pain 09/25/2016    Past Surgical History:  Procedure Laterality Date  . APPENDECTOMY    . arm surgery Right   . FOREIGN BODY REMOVAL Left 11/05/2016   Procedure: Left elbow wound exploration and foreign body removal;  Surgeon: Bradly Bienenstock, MD;  Location: [facility] OR;  Service: Orthopedics;  Laterality: Left;  . FOREIGN BODY REMOVAL Left 12/22/2016    Procedure: LEFT FOREARM FOREIGN BODY EXCISION;  Surgeon: Bradly Bienenstock, MD;  Location: [facility] OR;  Service: Orthopedics;  Laterality: Left;  . TONSILLECTOMY         History reviewed. No pertinent family history.  Social History   Tobacco Use  . Smoking status: Former Smoker    Types: Cigarettes  . Smokeless tobacco: Never Used  Vaping Use  . Vaping Use: Never used  Substance Use Topics  . Alcohol use: No    Comment: last use Feb 2018  . Drug use: Yes    Types: Marijuana    Home Medications Prior to Admission medications   Medication Sig Start Date End Date Taking? Authorizing Provider  Hydrocodone-Acetaminophen (VICODIN) 5-300 MG TABS Take 1 tablet by mouth 2 (two) times daily as needed (PAIN). 12/22/16   Bradly Bienenstock, MD  naproxen (NAPROSYN) 500 MG tablet Take 1 po BID with food prn pain Patient not taking: Reported on 12/18/2016 11/21/16   Devoria Albe, MD  nystatin cream (MYCOSTATIN) Apply 1 application topically 4 (four) times daily. Apply to affected area every 4-6 hours x 10 days 04/06/19   Mesner, Barbara Cower, MD    Allergies    Augmentin [amoxicillin-pot clavulanate]  Review of Systems   Review of Systems  Constitutional: Positive for chills and fever.  HENT: Positive for sore throat. Negative for congestion, tinnitus, trouble swallowing and voice change.   Eyes: Negative for visual disturbance.  Respiratory: Negative for cough and  shortness of breath.   Cardiovascular: Negative for chest pain.  Gastrointestinal: Negative for abdominal pain, diarrhea, nausea and vomiting.  Genitourinary: Negative for enuresis, flank pain, frequency and genital sores.  Musculoskeletal: Negative for back pain.  Skin: Negative for rash.  Neurological: Negative for dizziness and headaches.  Hematological: Does not bruise/bleed easily.    Physical Exam Updated Vital Signs BP (!) 140/102 (BP Location: Left Arm)   Pulse 80   Temp 98.6 F (37 C) (Oral)   Resp 17   Ht 5\' 11"  (1.803 m)   Wt  92.1 kg   SpO2 100%   BMI 28.31 kg/m   Physical Exam Vitals and nursing note reviewed.  Constitutional:      General: He is not in acute distress.    Appearance: He is not ill-appearing.  HENT:     Head: Normocephalic and atraumatic.     Right Ear: Tympanic membrane, ear canal and external ear normal.     Left Ear: Tympanic membrane, ear canal and external ear normal.     Ears:     Comments: Patient's right TM was visualized fluid bubbles visualized no signs of erythema or infection noted.    Nose: No congestion.     Mouth/Throat:     Mouth: Mucous membranes are moist.     Pharynx: Oropharynx is clear. No oropharyngeal exudate or posterior oropharyngeal erythema.  Eyes:     General: No scleral icterus. Cardiovascular:     Rate and Rhythm: Normal rate and regular rhythm.     Pulses: Normal pulses.     Heart sounds: No murmur heard.  No friction rub. No gallop.   Pulmonary:     Effort: No respiratory distress.     Breath sounds: No wheezing, rhonchi or rales.  Abdominal:     General: There is no distension.     Palpations: Abdomen is soft.     Tenderness: There is no abdominal tenderness. There is no right CVA tenderness, left CVA tenderness or guarding.  Musculoskeletal:        General: No swelling.  Skin:    General: Skin is warm and dry.     Capillary Refill: Capillary refill takes less than 2 seconds.     Findings: Lesion present. No rash.     Comments: Patient has a small lesion noted on patient's right upper chest.  It was a papule, no surrounding erythema, no scaling noted, no fluctuance or induration felt on exam nontender to palpation.  Neurological:     Mental Status: He is alert.  Psychiatric:        Mood and Affect: Mood normal.     ED Results / Procedures / Treatments   Labs (all labs ordered are listed, but only abnormal results are displayed) Labs Reviewed  RESP PANEL BY RT PCR (RSV, FLU A&B, COVID) - Abnormal; Notable for the following components:       Result Value   SARS Coronavirus 2 by RT PCR POSITIVE (*)    All other components within normal limits    EKG None  Radiology No results found.  Procedures Procedures (including critical care time)  Medications Ordered in ED Medications - No data to display  ED Course  I have reviewed the triage vital signs and the nursing notes.  Pertinent labs & imaging results that were available during my care of the patient were reviewed by me and considered in my medical decision making (see chart for details).    MDM Rules/Calculators/A&P  I have personally reviewed all imaging, labs and have interpreted them.  Patient presents emergency department with chief complaint of general malaise, sore throat, lesion on chest concern for cancer.  He was alert, did not appear in acute distress, vital signs reassuring.  Will order respiratory panel for further evaluation.  Respiratory panel positive for Covid 19, negative for influenza A/B, RSV.  Will consult with infusion clinic as he has a BMI of greater than 25 would benefit from infusion.  I have low suspicion if  patient is Covid positive would  require hospitalization as he has no new oxygen requirements, no signs of respiratory distress, vital signs reassuring, little comorbidities which resulted in adverse effects.  Low suspicion for pneumonia or systemic infection as patient is nontoxic-appearing, vital signs reassuring, lung sounds are clear bilaterally, no obvious source infection on exam.  Low suspicion for skin malignancy as he has no associated symptoms like night sweats, fevers, unexplained weight loss, bone pain no history of cancer.  I suspect patient has been scratching at this papule which  caused it to bleed.  Will encourage patient to follow-up with PCP or dermatologist for further evaluation.  I suspect patient's general body aches and sore throat are secondary to a viral URI since he had recent travels.   Will recommend self quarantining until results are back, if positive 10-day quarantine and follow-up with post Covid care for further evaluation if negative follow-up with PCP.  Patient resting comfortably, no acute distress, vital signs reassuring, no indication for hospital admission.  Patient was given at home care as well strict return precautions.  Patient verbalized understanding agreed to plan. Final Clinical Impression(s) / ED Diagnoses Final diagnoses:  Viral illness    Rx / DC Orders ED Discharge Orders    None       Carroll Sage, PA-C 03/02/20 2221    Charlynne Pander, MD 03/02/20 [phone]

## 2020-03-03 ENCOUNTER — Telehealth (HOSPITAL_COMMUNITY): Payer: Self-pay | Admitting: Adult Health

## 2020-03-03 ENCOUNTER — Telehealth: Payer: Self-pay | Admitting: Nurse Practitioner

## 2020-03-03 NOTE — Telephone Encounter (Signed)
Called and LMOM regarding monoclonal antibody treatment for COVID 19 given to those who are at risk for complications and/or hospitalization of the virus.  Patient meets criteria based on: BMI greater than 25  Call back number given: [phone]  My chart message: sent  Santana Edell, NP  

## 2020-03-03 NOTE — Telephone Encounter (Signed)
Called to discuss with patient about Covid symptoms and the use of casirivimab/imdevimab, a monoclonal antibody infusion for those with mild to moderate Covid symptoms and at a high risk of hospitalization.  Pt is qualified for this infusion at the [facility] due to; Specific high risk criteria : BMI > 25   Message left to call back our hotline [phone].  A message was also left with patient contact Acey Lav (wife, per Epic demographic information) for patient to call hotline number back.  Wynne Dust, NP [facility]

## 2020-03-20 ENCOUNTER — Ambulatory Visit: Payer: Self-pay

## 2021-05-04 ENCOUNTER — Other Ambulatory Visit: Payer: Self-pay

## 2021-07-12 ENCOUNTER — Ambulatory Visit: Payer: Managed Care, Other (non HMO) | Admitting: Pulmonary Disease

## 2021-07-12 ENCOUNTER — Encounter: Payer: Self-pay | Admitting: Pulmonary Disease

## 2021-07-12 ENCOUNTER — Other Ambulatory Visit: Payer: Self-pay

## 2021-07-12 ENCOUNTER — Ambulatory Visit (INDEPENDENT_AMBULATORY_CARE_PROVIDER_SITE_OTHER): Payer: Managed Care, Other (non HMO)

## 2021-07-12 VITALS — BP 128/74 | HR 82 | Ht 71.0 in | Wt 224.0 lb

## 2021-07-12 DIAGNOSIS — R053 Chronic cough: Secondary | ICD-10-CM | POA: Diagnosis not present

## 2021-07-12 MED ORDER — FLUTICASONE-SALMETEROL 115-21 MCG/ACT IN AERO: 2.0000 | INHALATION_SPRAY | Freq: Two times a day (BID) | RESPIRATORY_TRACT | 12 refills | Status: AC

## 2021-07-12 NOTE — Progress Notes (Signed)
Synopsis: Referred in February 2023 for mucous in lung, self referral  Subjective:   PATIENT ID: Shane Wheeler GENDER: male DOB: 10-31-1985, MRN: [id number]  HPI  Chief Complaint  Patient presents with   Consult    Self referral for chronic cough for the past 2 years. Cough is productive. Phlegm ranges from white to yellow. Denies any increased SOB.    Shane Wheeler is a 36 year old male, former smoker with history of asthma who is referred to pulmonary clinic for mucous in his lungs.  Patient reports having cough over the last year that is productive of mucus.  He also reports intermittent wheezing with the cough.  He also notes shortness of breath with the cough and also dyspnea with working out.  He reports having asthma in childhood.  He previously smoked marijuana heavily and has cut back significantly.  He completely quit 6 months ago without much improvement in his respiratory symptoms.  He currently is vaporizing marijuana flower.  He denies seasonal allergies, sinus congestion or postnasal drainage or issues with reflux.  He was trialed on over-the-counter omeprazole for 3 to 6 months without changes in his cough.  He does not smoke cigarettes.  Lives with his wife and 2 cats.  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  Past Medical History:  Diagnosis Date   ADHD (attention deficit hyperactivity disorder)    Asthma    as a child   Foreign body (FB) in soft tissue    left elbow   History of Meckel's diverticulum      No family history on file.   Social History   Socioeconomic History   Marital status: Single    Spouse name: Not on file   Number of children: Not on file   Years of education: Not on file   Highest education level: Not on file  Occupational History   Not on file  Tobacco Use   Smoking status: Former    Types: Cigarettes   Smokeless tobacco: Never  Vaping Use   Vaping Use: Never used  Substance and Sexual Activity    Alcohol use: No    Comment: last use Feb 2018   Drug use: Yes    Types: Marijuana   Sexual activity: Not on file  Other Topics Concern   Not on file  Social History Narrative   Not on file   Social Determinants of Health   Financial Resource Strain: Not on file  Food Insecurity: Not on file  Transportation Needs: Not on file  Physical Activity: Not on file  Stress: Not on file  Social Connections: Not on file  Intimate Partner Violence: Not on file     Allergies  Allergen Reactions   Augmentin [Amoxicillin-Pot Clavulanate] Other (See Comments)    UNKNOWN REACTION  Has patient had a PCN reaction causing immediate rash, facial/tongue/throat swelling, SOB or lightheadedness with hypotension: Unsure Has patient had a PCN reaction causing severe rash involving mucus membranes or skin necrosis: Unsure Has patient had a PCN reaction that required hospitalization Unsure Has patient had a PCN reaction occurring within the last 10 years: No If all of the above answers are "NO", then may proceed with Cephalosporin use.     Outpatient Medications Prior to Visit  Medication Sig Dispense Refill   Hydrocodone-Acetaminophen (VICODIN) 5-300 MG TABS Take 1 tablet by mouth 2 (two) times daily as needed (PAIN). 20 each 0   naproxen (NAPROSYN) 500 MG tablet  Take 1 po BID with food prn pain (Patient not taking: Reported on 12/18/2016) 30 tablet 0   nystatin cream (MYCOSTATIN) Apply 1 application topically 4 (four) times daily. Apply to affected area every 4-6 hours x 10 days 30 g 1   No facility-administered medications prior to visit.   Review of Systems  Constitutional:  Negative for chills, fever, malaise/fatigue and weight loss.  HENT:  Negative for congestion, sinus pain and sore throat.   Eyes: Negative.   Respiratory:  Positive for cough and shortness of breath. Negative for hemoptysis, sputum production and wheezing.   Cardiovascular:  Negative for chest pain, palpitations, orthopnea,  claudication and leg swelling.  Gastrointestinal:  Positive for abdominal pain. Negative for heartburn, nausea and vomiting.  Genitourinary: Negative.   Musculoskeletal:  Negative for joint pain and myalgias.  Skin:  Negative for rash.  Neurological:  Negative for weakness.  Endo/Heme/Allergies: Negative.   Psychiatric/Behavioral: Negative.     Objective:   Vitals:   07/12/21 1606  BP: 128/74  Pulse: 82  SpO2: 97%  Weight: 224 lb (101.6 kg)  Height: 5\' 11"  (1.803 m)   Physical Exam Constitutional:      General: He is not in acute distress. HENT:     Head: Normocephalic and atraumatic.  Eyes:     Extraocular Movements: Extraocular movements intact.     Conjunctiva/sclera: Conjunctivae normal.     Pupils: Pupils are equal, round, and reactive to light.  Cardiovascular:     Rate and Rhythm: Normal rate and regular rhythm.     Pulses: Normal pulses.     Heart sounds: Normal heart sounds. No murmur heard. Abdominal:     General: Bowel sounds are normal.     Palpations: Abdomen is soft.  Musculoskeletal:     Right lower leg: No edema.     Left lower leg: No edema.  Lymphadenopathy:     Cervical: No cervical adenopathy.  Skin:    General: Skin is warm and dry.  Neurological:     General: No focal deficit present.     Mental Status: He is alert.  Psychiatric:        Mood and Affect: Mood normal.        Behavior: Behavior normal.        Thought Content: Thought content normal.        Judgment: Judgment normal.   CBC    Component Value Date/Time   WBC 10.1 04/05/2019 2315   RBC 5.49 04/05/2019 2315   HGB 16.6 04/05/2019 2315   HCT 48.2 04/05/2019 2315   PLT 245 04/05/2019 2315   MCV 87.8 04/05/2019 2315   MCH 30.2 04/05/2019 2315   MCHC 34.4 04/05/2019 2315   RDW 11.9 04/05/2019 2315   LYMPHSABS 3.9 04/05/2019 2315   MONOABS 0.8 04/05/2019 2315   EOSABS 0.1 04/05/2019 2315   BASOSABS 0.0 04/05/2019 2315   BMP Latest Ref Rng & Units 04/05/2019 12/22/2016  11/21/2016  Glucose 70 - 99 mg/dL 82 95 92  BUN 6 - 20 mg/dL 10 15 17   Creatinine 0.61 - 1.24 mg/dL 1.610.98 0.960.87 0.450.90  Sodium 135 - 145 mmol/L 134(L) 139 140  Potassium 3.5 - 5.1 mmol/L 3.7 3.6 3.5  Chloride 98 - 111 mmol/L 97(L) 105 102  CO2 22 - 32 mmol/L 24 25 -  Calcium 8.9 - 10.3 mg/dL 9.6 9.4 -   Chest imaging: CT Chest 04/06/19 Mediastinum/Nodes: Thoracic inlet is within normal limits. Mild residual thymus tissue is noted in  the anterior mediastinum. No hilar or mediastinal adenopathy is noted. The esophagus as visualized is within normal limits.   Lungs/Pleura: Lungs are well aerated bilaterally. Tiny less than 5 mm nodule is noted in the left lower lobe best seen on image number 131 of series 3. A few other small nodules are noted in the lower lobe on the left. No focal infiltrate or sizable effusion is noted.  PFT: No flowsheet data found.  Labs:  Path:  Echo:  Heart Catheterization:  Assessment & Plan:   Chronic cough - Plan: DG Chest 2 View, fluticasone-salmeterol (ADVAIR HFA) 115-21 MCG/ACT inhaler  Discussion: Shane Wheeler is a 35 year old male, former smoker with history of asthma who is referred to pulmonary clinic for mucous in his lungs.  His cough appears secondary to reactive airways disease given associated wheezing and shortness of breath along with mucus production.  He is to start Advair HFA 1 15-21 MCG 2 puffs twice daily.  We will check a chest radiograph today.  Follow-up in 4 weeks via video visit.  Melody Comas, MD [facility] Office: [phone]   Current Outpatient Medications:    fluticasone-salmeterol (ADVAIR HFA) 115-21 MCG/ACT inhaler, Inhale 2 puffs into the lungs 2 (two) times daily., Disp: 1 each, Rfl: 12

## 2021-07-12 NOTE — Patient Instructions (Addendum)
Your cough could be due to reactive airways disease or asthma  Take advair 115-6mcg 2 puffs twice daily  - rinse mouth out after each use  We will check a chest x-ray today  Follow up in 4 weeks via video visit

## 2022-01-27 ENCOUNTER — Telehealth: Payer: Self-pay | Admitting: Pulmonary Disease

## 2022-01-27 NOTE — Telephone Encounter (Signed)
Ok to switch providers per patient request.

## 2022-01-27 NOTE — Telephone Encounter (Signed)
Called and spoke with pt who stated he was wanting to switch providers from Dr. Francine Graven to Dr. Sherene Sires as he feels like he did not receive much help with his cough and other symptoms from prior OV.  Pt said that an appt had been scheduled for tomorrow 8/22 and when I looked at that appt, saw that it was made for pt to have a new pt appt with Dr. Sherene Sires. Stated to pt that there is a protocol that we have to abide by when pts are wanting to switch from one provider another as we have to get the approval from both providers. Also stated to pt that that appt should not have been scheduled until we have heard from both providers but I told pt that I was going to leave that appt as scheduled hoping that we can hear from both JD and MW by today.  Pt verbalized understanding and said that he really just needs to be seen before 8/31 as he has lost his job and after the 31st, he will not have any insurance. In case we don't hear from both providers today, have tentatively scheduled pt an appt with JD 8/31 for a follow up as he will still have insurance that day but pt is really hoping to be able to switch providers.  Routing this to Dr. Sherene Sires and Dr. Francine Graven as high priority due to time constraint that we have. Please advise if both of you are okay with pt switching.

## 2022-01-27 NOTE — Telephone Encounter (Signed)
Called and spoke with pt letting him know that we heard back from both MW and JD and both were fine with the provider switch. Pt verbalized understanding. Stated to pt to bring meds with him to appt and he verbalized understanding. Cancelled the appt that I had scheduled for pt to see JD due to him switching providers. Nothing further needed.

## 2022-01-27 NOTE — Telephone Encounter (Signed)
Just go ahead and over ride if losing insurance - I won't agree to become his regular pulmonary doc without Dr Francine Graven agreeing but I can certainly see him once to see if I can help.  He must bring all active meds/ inhalers/ solutions including otcs for me to be able to troubleshoot the problem though

## 2022-01-28 ENCOUNTER — Ambulatory Visit: Payer: Managed Care, Other (non HMO) | Admitting: Internal Medicine

## 2022-01-31 ENCOUNTER — Ambulatory Visit: Payer: Managed Care, Other (non HMO) | Admitting: Internal Medicine

## 2022-02-06 ENCOUNTER — Ambulatory Visit: Payer: Managed Care, Other (non HMO) | Admitting: Pulmonary Disease

## 2023-03-14 ENCOUNTER — Emergency Department (HOSPITAL_COMMUNITY): Payer: Self-pay

## 2023-03-14 ENCOUNTER — Encounter (HOSPITAL_COMMUNITY): Payer: Self-pay

## 2023-03-14 ENCOUNTER — Other Ambulatory Visit: Payer: Self-pay

## 2023-03-14 ENCOUNTER — Emergency Department (HOSPITAL_COMMUNITY)
Admission: EM | Admit: 2023-03-14 | Discharge: 2023-03-14 | Disposition: A | Discharge status: Home / Self Care | Payer: Self-pay | Attending: Emergency Medicine | Admitting: Emergency Medicine

## 2023-03-14 DIAGNOSIS — R110 Nausea: Secondary | ICD-10-CM | POA: Insufficient documentation

## 2023-03-14 DIAGNOSIS — R1013 Epigastric pain: Secondary | ICD-10-CM | POA: Insufficient documentation

## 2023-03-14 DIAGNOSIS — J45909 Unspecified asthma, uncomplicated: Secondary | ICD-10-CM | POA: Insufficient documentation

## 2023-03-14 DIAGNOSIS — D72829 Elevated white blood cell count, unspecified: Secondary | ICD-10-CM | POA: Insufficient documentation

## 2023-03-14 DIAGNOSIS — R109 Unspecified abdominal pain: Secondary | ICD-10-CM

## 2023-03-14 LAB — COMPREHENSIVE METABOLIC PANEL
ALT: 40 U/L (ref 0–44)
AST: 30 U/L (ref 15–41)
Albumin: 5 g/dL (ref 3.5–5.0)
Alkaline Phosphatase: 76 U/L (ref 38–126)
Anion gap: 10 (ref 5–15)
BUN: 12 mg/dL (ref 6–20)
CO2: 23 mmol/L (ref 22–32)
Calcium: 9.3 mg/dL (ref 8.9–10.3)
Chloride: 106 mmol/L (ref 98–111)
Creatinine, Ser: 0.95 mg/dL (ref 0.61–1.24)
GFR, Estimated: >60 mL/min (ref >60)
Glucose, Bld: 91 mg/dL (ref 70–99)
Potassium: 3.2 mmol/L — ABNORMAL LOW (ref 3.5–5.1)
Sodium: 139 mmol/L (ref 135–145)
Total Bilirubin: 0.8 mg/dL (ref 0.3–1.2)
Total Protein: 7.9 g/dL (ref 6.5–8.1)

## 2023-03-14 LAB — URINALYSIS, ROUTINE W REFLEX MICROSCOPIC
Bilirubin Urine: NEGATIVE
Glucose, UA: NEGATIVE mg/dL
Hgb urine dipstick: NEGATIVE
Ketones, ur: NEGATIVE mg/dL
Leukocytes,Ua: NEGATIVE
Nitrite: NEGATIVE
Protein, ur: NEGATIVE mg/dL
Specific Gravity, Urine: 1.004 — ABNORMAL LOW (ref 1.005–1.030)
pH: 7 (ref 5.0–8.0)

## 2023-03-14 LAB — LIPASE, BLOOD: Lipase: 46 U/L (ref 11–51)

## 2023-03-14 LAB — CBC
HCT: 42.9 % (ref 39.0–52.0)
Hemoglobin: 15.4 g/dL (ref 13.0–17.0)
MCH: 31.2 pg (ref 26.0–34.0)
MCHC: 35.9 g/dL (ref 30.0–36.0)
MCV: 87 fL (ref 80.0–100.0)
Platelets: 263 10*3/uL (ref 150–400)
RBC: 4.93 MIL/uL (ref 4.22–5.81)
RDW: 11.9 % (ref 11.5–15.5)
WBC: 11.6 10*3/uL — ABNORMAL HIGH (ref 4.0–10.5)
nRBC: 0 % (ref 0.0–0.2)

## 2023-03-14 MED ORDER — OMEPRAZOLE 20 MG PO CPDR: 20.0000 mg | DELAYED_RELEASE_CAPSULE | Freq: Every day | ORAL | 0 refills | Status: AC

## 2023-03-14 MED ORDER — ONDANSETRON 4 MG PO TBDP: 4.0000 mg | ORAL_TABLET | Freq: Three times a day (TID) | ORAL | 0 refills | Status: AC | PRN

## 2023-03-14 MED ORDER — POTASSIUM CHLORIDE CRYS ER 20 MEQ PO TBCR
40.0000 meq | EXTENDED_RELEASE_TABLET | Freq: Once | ORAL | Status: AC
  Administered 2023-03-14: 40 meq via ORAL
  Filled 2023-03-14: qty 2

## 2023-03-14 MED ORDER — SODIUM CHLORIDE 0.9 % IV BOLUS
1000.0000 mL | Freq: Once | INTRAVENOUS | Status: AC
  Administered 2023-03-14: 1000 mL via INTRAVENOUS

## 2023-03-14 MED ORDER — LIDOCAINE VISCOUS HCL 2 % MT SOLN
15.0000 mL | Freq: Once | OROMUCOSAL | Status: AC
  Administered 2023-03-14: 15 mL via ORAL
  Filled 2023-03-14: qty 15

## 2023-03-14 MED ORDER — ONDANSETRON HCL 4 MG/2ML IJ SOLN
4.0000 mg | Freq: Once | INTRAMUSCULAR | Status: AC
  Administered 2023-03-14: 4 mg via INTRAVENOUS
  Filled 2023-03-14: qty 2

## 2023-03-14 MED ORDER — SUCRALFATE 1 G PO TABS: 1.0000 g | ORAL_TABLET | Freq: Three times a day (TID) | ORAL | 0 refills | Status: AC

## 2023-03-14 MED ORDER — ALUM & MAG HYDROXIDE-SIMETH 200-200-20 MG/5ML PO SUSP
30.0000 mL | Freq: Once | ORAL | Status: AC
  Administered 2023-03-14: 30 mL via ORAL
  Filled 2023-03-14: qty 30

## 2023-03-14 NOTE — Discharge Instructions (Signed)
Your history, exam, and evaluation today are consistent with acute gastritis or GI tract/stomach inflammation may have been related to the beer earlier today.  Your ultrasound did not show evidence of gallbladder problem and your labs were overall reassuring as we discussed.  We feel you are safe for discharge home and agreed together with a shared decision-making conversation hold on CT imaging at this time.  Please use the medicines to help with stomach acid and nausea and rest and stay hydrated.  Please avoid spicy and greasy foods.  Please follow-up with a GI doctor and a primary doctor.  If any symptoms change or worsen acutely, please return to the nearest emergency department.

## 2023-03-14 NOTE — ED Provider Notes (Signed)
[place] EMERGENCY DEPARTMENT AT [facility] Provider Note   [name]: [id number] Arrival date & time: 03/14/23  1748     History  Chief Complaint  Patient presents with   Abdominal Pain    Shane Wheeler is a 37 y.o. male.  The history is provided by the patient and medical records. No language interpreter was used.  Abdominal Pain Pain location:  Epigastric Pain quality: cramping   Pain radiates to:  Does not radiate Pain severity:  Severe Onset quality:  Gradual Duration:  1 day Timing:  Constant Progression:  Unchanged Chronicity:  Recurrent Context: alcohol use (one beer earlier) and previous surgery (previous appy)   Context: not trauma   Relieved by:  Nothing Worsened by:  Nothing Ineffective treatments:  None tried Associated symptoms: nausea   Associated symptoms: no chest pain, no chills, no constipation, no cough, no diarrhea, no dysuria, no fatigue, no fever, no flatus and no vomiting        Home Medications Prior to Admission medications   Medication Sig Start Date End Date Taking? Authorizing Provider  fluticasone-salmeterol (ADVAIR HFA) 115-21 MCG/ACT inhaler Inhale 2 puffs into the lungs 2 (two) times daily. 07/12/21   Martina Sinner, MD      Allergies    Augmentin [amoxicillin-pot clavulanate]    Review of Systems   Review of Systems  Constitutional:  Negative for chills, fatigue and fever.  HENT:  Negative for congestion.   Respiratory:  Negative for cough.   Cardiovascular:  Negative for chest pain.  Gastrointestinal:  Positive for abdominal pain and nausea. Negative for constipation, diarrhea, flatus and vomiting.  Genitourinary:  Negative for dysuria, flank pain and frequency.  Musculoskeletal:  Negative for back pain and neck pain.  Neurological:  Negative for headaches.  Psychiatric/Behavioral:  Negative for agitation.   All other systems reviewed and are negative.   Physical Exam Updated Vital Signs BP 127/81 (BP  Location: Left Arm)   Pulse 69   Temp 98.7 F (37.1 C) (Oral)   Resp 16   Ht 5\' 11"  (1.803 m)   Wt 97.5 kg   SpO2 100%   BMI 29.99 kg/m  Physical Exam Vitals and nursing note reviewed.  Constitutional:      General: He is not in acute distress.    Appearance: He is well-developed. He is not ill-appearing, toxic-appearing or diaphoretic.  HENT:     Head: Normocephalic and atraumatic.     Mouth/Throat:     Mouth: Mucous membranes are moist.  Eyes:     Conjunctiva/sclera: Conjunctivae normal.  Cardiovascular:     Rate and Rhythm: Normal rate and regular rhythm.     Heart sounds: No murmur heard. Pulmonary:     Effort: Pulmonary effort is normal. No respiratory distress.     Breath sounds: Normal breath sounds.  Abdominal:     General: Abdomen is flat. Bowel sounds are normal.     Palpations: Abdomen is soft.     Tenderness: There is abdominal tenderness in the epigastric area. There is no right CVA tenderness, left CVA tenderness, guarding or rebound.  Musculoskeletal:        General: No swelling.     Cervical back: Neck supple.  Skin:    General: Skin is warm and dry.     Capillary Refill: Capillary refill takes less than 2 seconds.  Neurological:     General: No focal deficit present.     Mental Status: He is alert.  Psychiatric:  Mood and Affect: Mood normal.     ED Results / Procedures / Treatments   Labs (all labs ordered are listed, but only abnormal results are displayed) Labs Reviewed  COMPREHENSIVE METABOLIC PANEL - Abnormal; Notable for the following components:      Result Value   Potassium 3.2 (*)    All other components within normal limits  CBC - Abnormal; Notable for the following components:   WBC 11.6 (*)    All other components within normal limits  URINALYSIS, ROUTINE W REFLEX MICROSCOPIC - Abnormal; Notable for the following components:   Color, Urine STRAW (*)    Specific Gravity, Urine 1.004 (*)    All other components within normal  limits  LIPASE, BLOOD    EKG None  Radiology US Abdomen Limited RUQ (LIVER/GB)  Result Date: 03/14/2023 CLINICAL DATA:  Epigastric abdominal pain EXAM: ULTRASOUND ABDOMEN LIMITED RIGHT UPPER QUADRANT COMPARISON:  CT chest 04/06/2019 FINDINGS: Gallbladder: No gallstones or wall thickening visualized. No sonographic Murphy sign noted by sonographer. Common bile duct: Diameter: 3.1 mm Liver: Increased echogenicity. No focal lesion. Portal vein is patent on color Doppler imaging with normal direction of blood flow towards the liver. Other: None. IMPRESSION: 1. No cholelithiasis or sonographic evidence for acute cholecystitis. 2. Increased hepatic parenchymal echogenicity suggestive of steatosis. Electronically Signed   By: Annia Belt M.D.   On: 03/14/2023 19:39    Procedures Procedures    Medications Ordered in ED Medications  potassium chloride SA (KLOR-CON M) CR tablet 40 mEq (has no administration in time range)  sodium chloride 0.9 % bolus 1,000 mL (1,000 mLs Intravenous New Bag/Given 03/14/23 1944)  ondansetron (ZOFRAN) injection 4 mg (4 mg Intravenous Given 03/14/23 1943)  alum & mag hydroxide-simeth (MAALOX/MYLANTA) 200-200-20 MG/5ML suspension 30 mL (30 mLs Oral Given 03/14/23 1942)    And  lidocaine (XYLOCAINE) 2 % viscous mouth solution 15 mL (15 mLs Oral Given 03/14/23 1942)    ED Course/ Medical Decision Making/ A&P                                 Medical Decision Making Amount and/or Complexity of Data Reviewed Labs: ordered. Radiology: ordered.  Risk OTC drugs. Prescription drug management.    Shane Wheeler is a 37 y.o. male with a past medical history significant for asthma, ADHD, and previous appendectomy who presents with recurrent epigastric abdominal pain with nausea and a near syncopal episode.  According to patient, over the years he has had pain in his epigastric area on and off.  He reports that several months ago he had an episode.  He said that he had  1 beer today and was having some more epigastric pain after.  Denies history of ulcers or alcohol gastritis in the past.  He said that his pain was moderate to severe in his epigastric area with nausea but no vomiting.  He reports he has not had any significant constipation or diarrhea.  No urinary changes.  No history of kidney stones.  No trauma.  No rashes reported.  No chest pain, shortness of breath, or cough.  No sick exposures.  He was concerned about gallbladder or pancreas or gastritis problems.  Denies any scrotal, testicle, or groin pain.  On exam, lungs clear.  Chest nontender.  Abdomen is tender in the epigastric area.  Good bowel sounds.  Flanks and back nontender.  No focal neurologic deficits.  Patient otherwise  well-appearing.  Clinically I do feel he needs labs, urinalysis, and we will get an ultrasound of his right upper quadrant to rule out acute cholecystitis.  If he still has symptoms despite medications with a GI cocktail and nausea medicine and his ultrasound is reassuring, would consider CT imaging with a shared decision-making conversation.  Will get labs.  Anticipate reassessment after workup to determine disposition.  If workup reassuring, suspect this more been alcohol related gastritis and will send home with Prilosec, Carafate, nausea medicine, and plan to follow-up with outpatient GI.  8:32 PM Ultrasound shows no acute cholecystitis.  Labs overall reassuring with mild leukocytosis and mild hypokalemia.  Will order oral potassium.  Patient had immediate relief after the GI cocktail, suspect gastritis related to the beer he drank and some underlying reflux.  We discussed getting a CT scan but he would prefer and I agree to hold on further imaging and give him prescription for Carafate, Prilosec, nausea medicine, have a follow-up with GI and PCP.  Patient agrees with plan of care and will discharge for outpatient follow-up.  He understood return precautions for any new or  worsening symptoms.         Final Clinical Impression(s) / ED Diagnoses Final diagnoses:  Epigastric pain  Abdominal pain, unspecified abdominal location  Nausea    Rx / DC Orders ED Discharge Orders          Ordered    sucralfate (CARAFATE) 1 g tablet  3 times daily with meals & bedtime        03/14/23 2035    omeprazole (PRILOSEC) 20 MG capsule  Daily        03/14/23 2035    ondansetron (ZOFRAN-ODT) 4 MG disintegrating tablet  Every 8 hours PRN        03/14/23 2035           Clinical Impression: 1. Epigastric pain   2. Abdominal pain, unspecified abdominal location   3. Nausea     Disposition: Discharge  Condition: Good  I have discussed the results, Dx and Tx plan with the pt(& family if present). He/she/they expressed understanding and agree(s) with the plan. Discharge instructions discussed at great length. Strict return precautions discussed and pt &/or family have verbalized understanding of the instructions. No further questions at time of discharge.    New Prescriptions   OMEPRAZOLE (PRILOSEC) 20 MG CAPSULE    Take 1 capsule (20 mg total) by mouth daily.   ONDANSETRON (ZOFRAN-ODT) 4 MG DISINTEGRATING TABLET    Take 1 tablet (4 mg total) by mouth every 8 (eight) hours as needed for nausea or vomiting.   SUCRALFATE (CARAFATE) 1 G TABLET    Take 1 tablet (1 g total) by mouth 4 (four) times daily -  with meals and at bedtime.    Follow Up: Gastroenterology, [facility] Sprang [address] [place] [address] [place] [address] [phone]     [facility] [address] [place] [address] [place] [address] [phone] Schedule an appointment as soon as possible for a visit    [facility] Emergency Department at [facility] [address] [place] [address] [phone]        Cheryln Balcom, Canary Brim, MD 03/14/23 2042

## 2023-03-14 NOTE — ED Triage Notes (Signed)
Pt complaining of upper abd pain that has been going on for a couple of years. Recently pain has increased, and reports discomfort/nausea when eating  or drinking. Today pt had a near syncopal episode and decided that he needed to be seen.

## 2023-05-12 IMAGING — DX DG CHEST 2V
2 series · 2 of 2 positions shown · non-contrast
Comparison: 04/05/2019

CLINICAL DATA: Cough

EXAM:
CHEST - 2 VIEW

[chest pa]
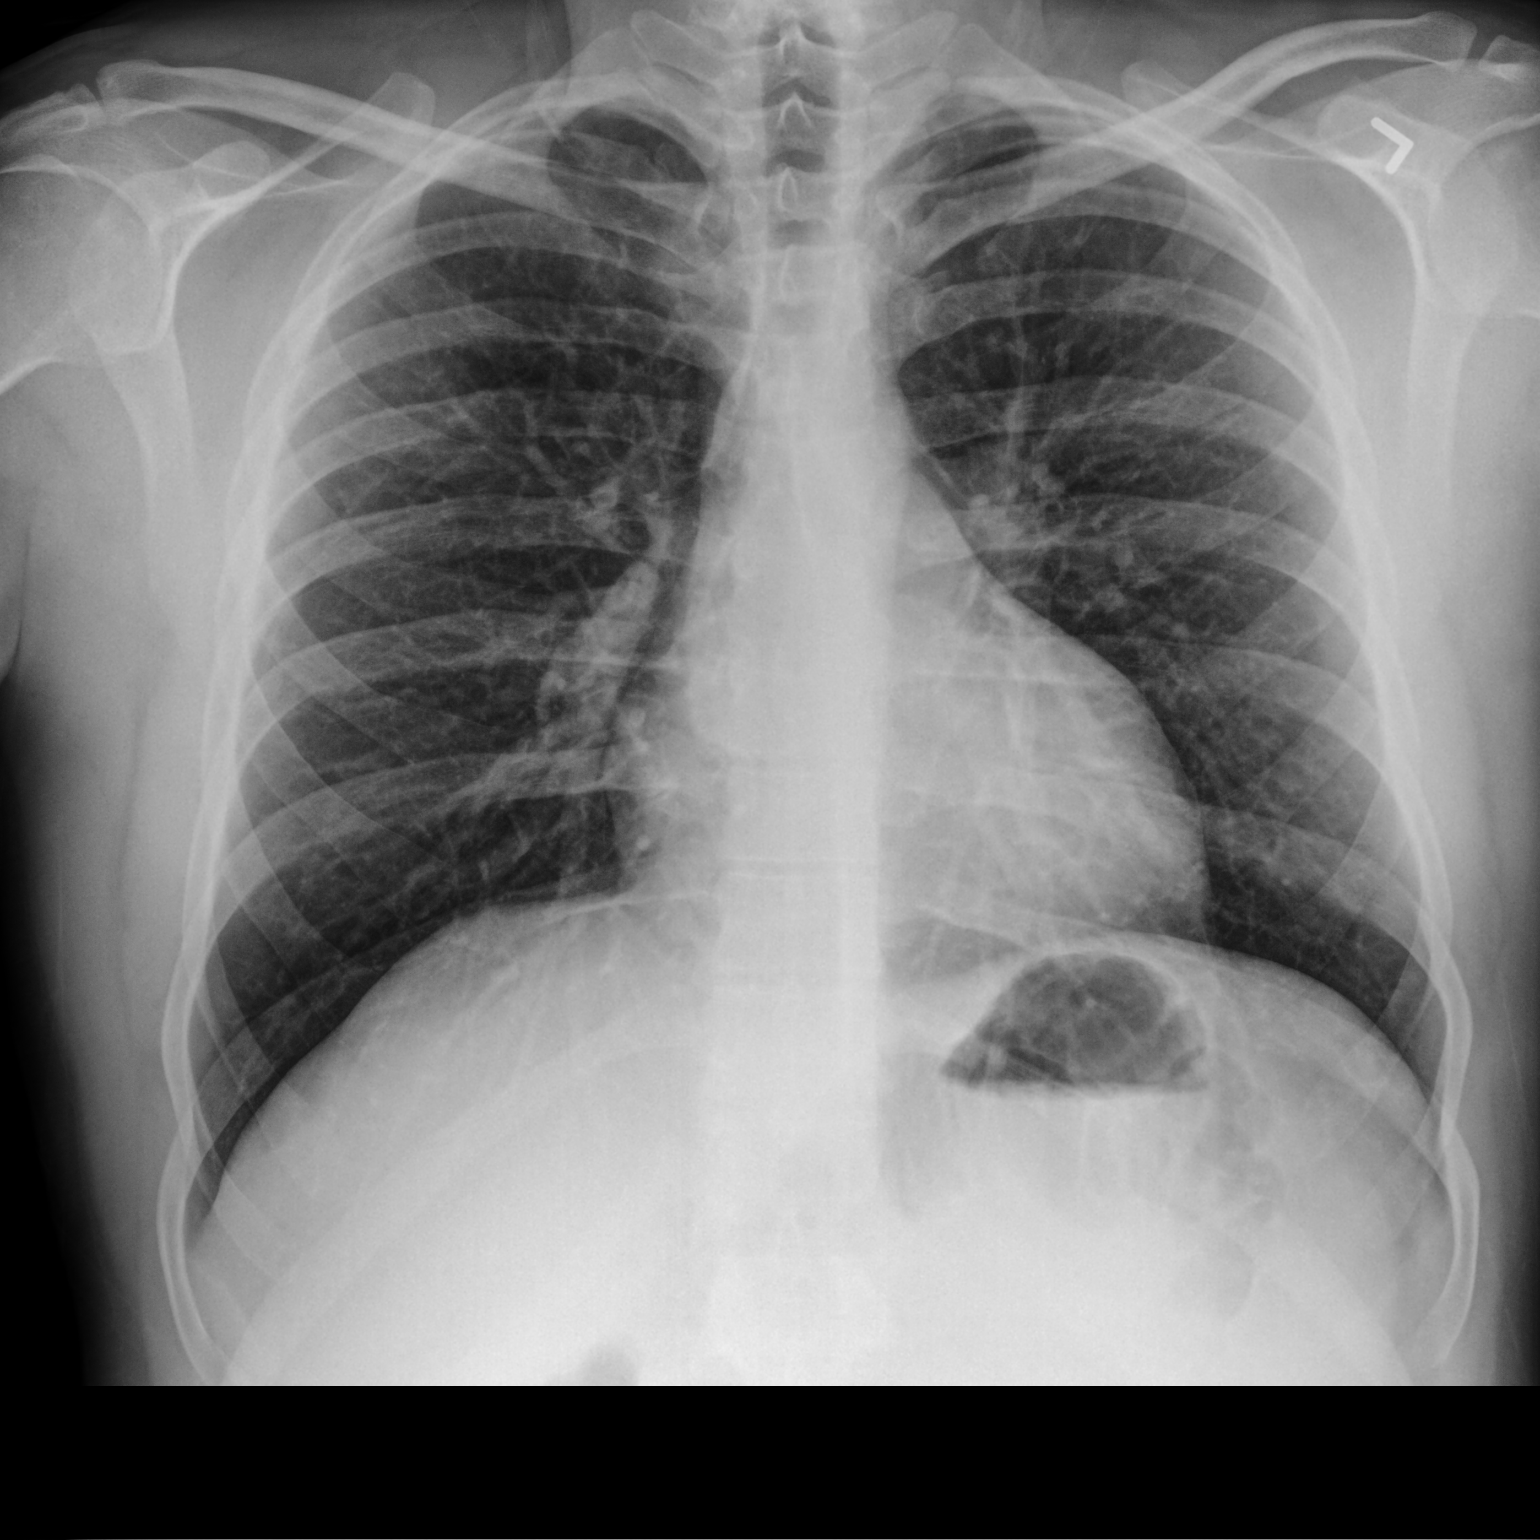

[chest lat]
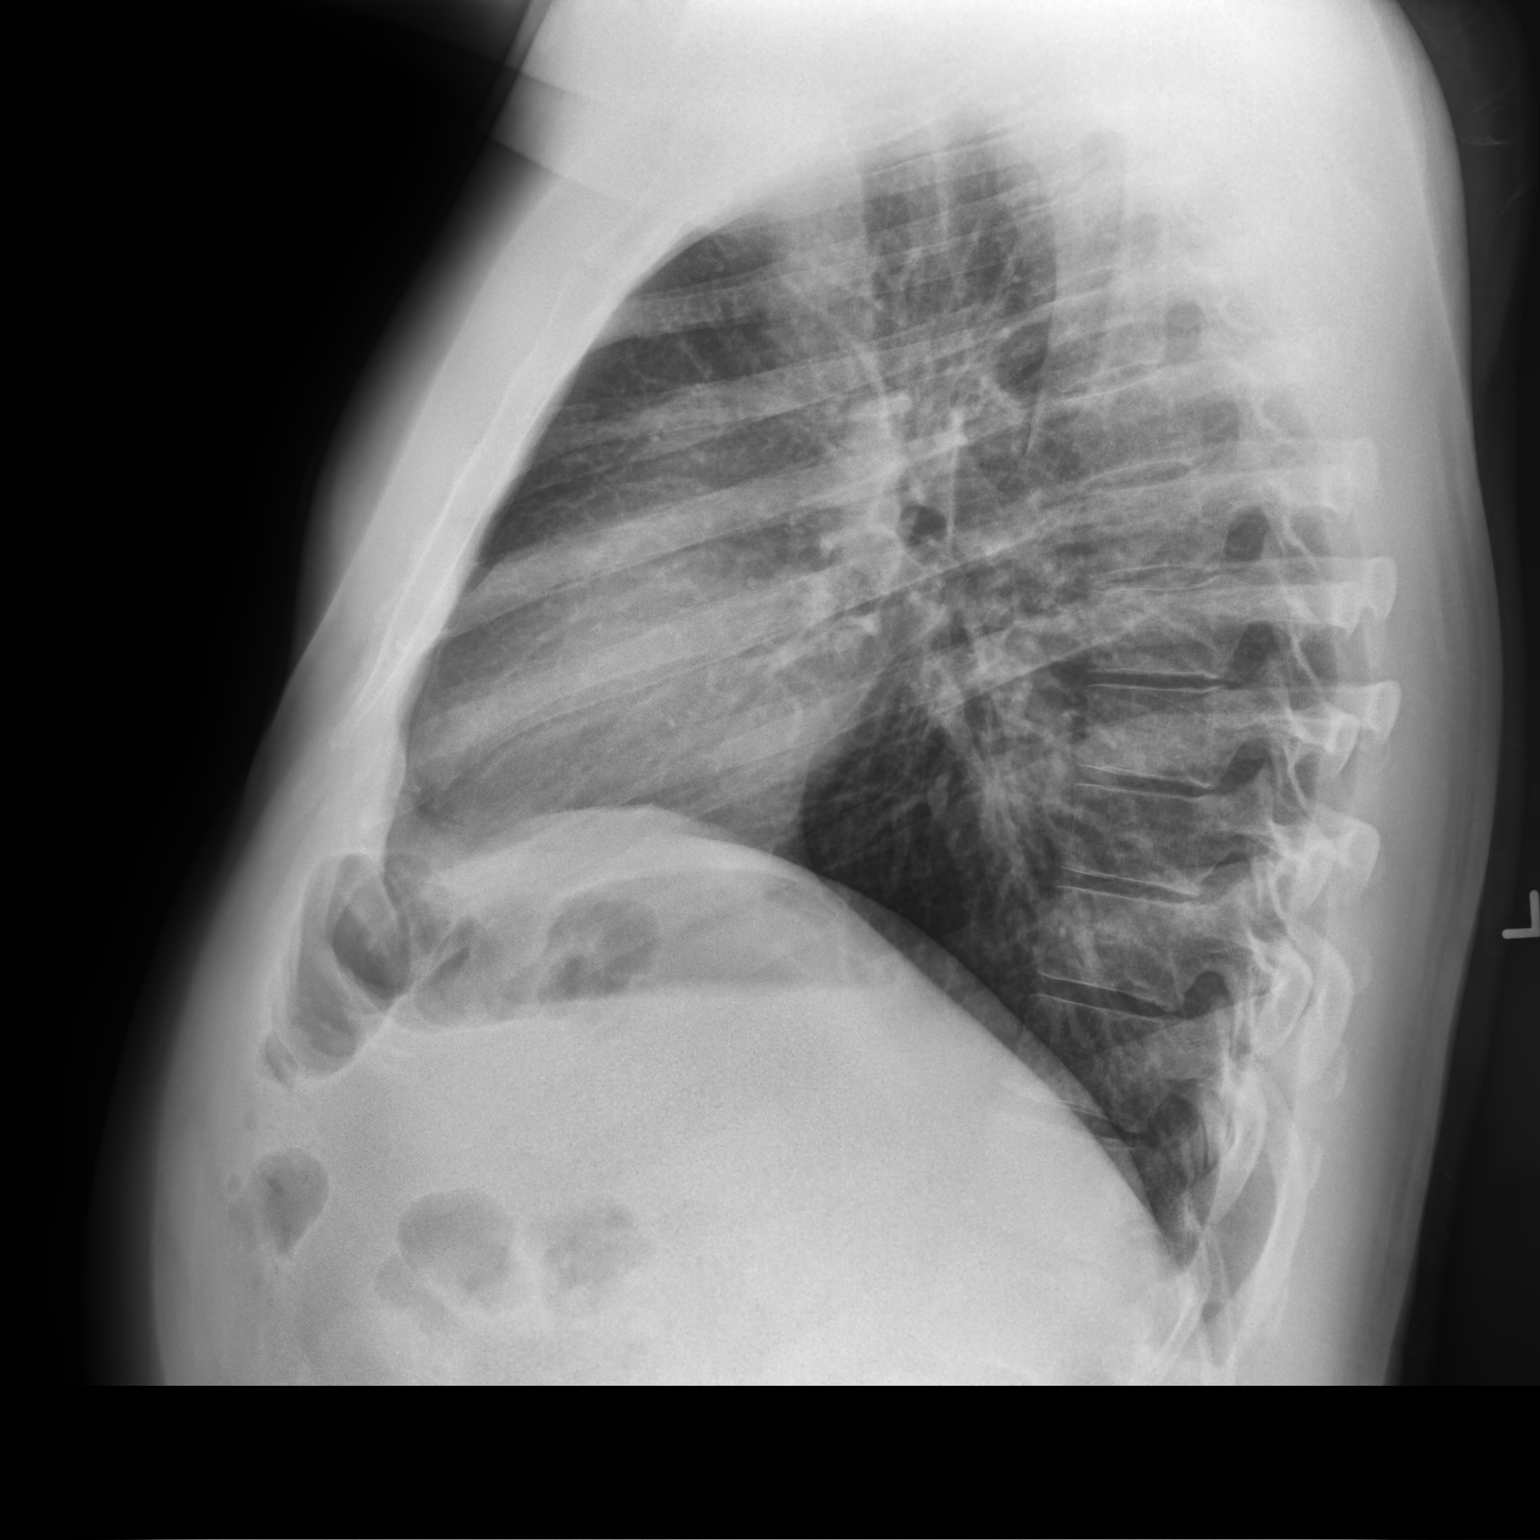

[2 of 2 positions shown; findings below may reference images not displayed]

FINDINGS: The heart size and mediastinal contours are within normal limits.
Both lungs are clear. The visualized skeletal structures are
unremarkable.
IMPRESSION: No active cardiopulmonary disease.
